# Patient Record
Sex: Female | Born: 1965 | Race: White | Hispanic: No | Marital: Married | Smoking: Never smoker
Health system: Southern US, Community
[De-identification: ages and names within clinical notes are randomized; demographics above are authoritative.]

## PROBLEM LIST (undated history)

## (undated) DIAGNOSIS — T7840XA Allergy, unspecified, initial encounter: Secondary | ICD-10-CM

## (undated) DIAGNOSIS — I872 Venous insufficiency (chronic) (peripheral): Secondary | ICD-10-CM

## (undated) DIAGNOSIS — R000 Tachycardia, unspecified: Secondary | ICD-10-CM

## (undated) DIAGNOSIS — Z803 Family history of malignant neoplasm of breast: Secondary | ICD-10-CM

## (undated) HISTORY — DX: Venous insufficiency (chronic) (peripheral): I87.2

## (undated) HISTORY — DX: Allergy, unspecified, initial encounter: T78.40XA

## (undated) HISTORY — DX: Family history of malignant neoplasm of breast: Z80.3

## (undated) HISTORY — DX: Tachycardia, unspecified: R00.0

---

## 1997-11-03 HISTORY — PX: FINGER SURGERY: SHX640

## 2000-01-13 HISTORY — PX: TONSILLECTOMY: SHX5217

## 2005-07-31 ENCOUNTER — Ambulatory Visit: Payer: Self-pay | Admitting: Unknown Physician Specialty

## 2005-08-06 ENCOUNTER — Ambulatory Visit: Payer: Self-pay | Admitting: Unknown Physician Specialty

## 2006-05-18 ENCOUNTER — Ambulatory Visit: Payer: Self-pay

## 2007-08-25 ENCOUNTER — Ambulatory Visit: Payer: Self-pay

## 2008-09-13 ENCOUNTER — Ambulatory Visit: Payer: Self-pay

## 2009-08-01 ENCOUNTER — Ambulatory Visit: Payer: Self-pay | Admitting: Unknown Physician Specialty

## 2009-08-01 ENCOUNTER — Other Ambulatory Visit: Payer: Self-pay | Admitting: Unknown Physician Specialty

## 2009-09-14 ENCOUNTER — Ambulatory Visit: Payer: Self-pay

## 2009-11-03 DIAGNOSIS — I872 Venous insufficiency (chronic) (peripheral): Secondary | ICD-10-CM

## 2009-11-03 HISTORY — DX: Venous insufficiency (chronic) (peripheral): I87.2

## 2010-08-21 ENCOUNTER — Ambulatory Visit: Payer: Self-pay

## 2010-12-25 ENCOUNTER — Ambulatory Visit: Payer: Self-pay | Admitting: Cardiology

## 2011-02-02 LAB — HM PAP SMEAR: HM Pap smear: NORMAL

## 2011-08-25 ENCOUNTER — Ambulatory Visit: Payer: Self-pay | Admitting: Unknown Physician Specialty

## 2012-09-04 LAB — HEMOGLOBIN A1C: Hgb A1c MFr Bld: 6 % (ref 4.0–6.0)

## 2012-09-07 ENCOUNTER — Ambulatory Visit: Payer: Self-pay | Admitting: General Practice

## 2012-09-13 ENCOUNTER — Ambulatory Visit: Payer: Self-pay | Admitting: Obstetrics and Gynecology

## 2012-09-13 LAB — TSH: Thyroid Stimulating Horm: 2.74 u[IU]/mL

## 2013-09-09 ENCOUNTER — Ambulatory Visit: Payer: Self-pay | Admitting: Obstetrics and Gynecology

## 2014-09-11 ENCOUNTER — Ambulatory Visit: Payer: Self-pay | Admitting: Obstetrics and Gynecology

## 2014-09-11 LAB — HM MAMMOGRAPHY: HM Mammogram: NEGATIVE

## 2014-11-13 ENCOUNTER — Other Ambulatory Visit: Payer: Self-pay | Admitting: General Surgery

## 2014-11-13 DIAGNOSIS — J069 Acute upper respiratory infection, unspecified: Secondary | ICD-10-CM

## 2014-11-13 MED ORDER — AZITHROMYCIN 250 MG PO TABS: ORAL_TABLET | ORAL | Status: AC

## 2015-02-02 ENCOUNTER — Ambulatory Visit (INDEPENDENT_AMBULATORY_CARE_PROVIDER_SITE_OTHER): Payer: 59 | Admitting: Internal Medicine

## 2015-02-02 ENCOUNTER — Encounter: Payer: Self-pay | Admitting: Internal Medicine

## 2015-02-02 ENCOUNTER — Encounter (INDEPENDENT_AMBULATORY_CARE_PROVIDER_SITE_OTHER): Payer: Self-pay

## 2015-02-02 VITALS — BP 124/84 | HR 80 | Temp 98.1°F | Resp 16 | Ht 65.25 in | Wt 205.2 lb

## 2015-02-02 DIAGNOSIS — E785 Hyperlipidemia, unspecified: Secondary | ICD-10-CM | POA: Diagnosis not present

## 2015-02-02 DIAGNOSIS — Z1159 Encounter for screening for other viral diseases: Secondary | ICD-10-CM | POA: Diagnosis not present

## 2015-02-02 DIAGNOSIS — I471 Supraventricular tachycardia: Secondary | ICD-10-CM | POA: Diagnosis not present

## 2015-02-02 DIAGNOSIS — R739 Hyperglycemia, unspecified: Secondary | ICD-10-CM

## 2015-02-02 DIAGNOSIS — E669 Obesity, unspecified: Secondary | ICD-10-CM

## 2015-02-02 DIAGNOSIS — E559 Vitamin D deficiency, unspecified: Secondary | ICD-10-CM

## 2015-02-02 DIAGNOSIS — G4700 Insomnia, unspecified: Secondary | ICD-10-CM

## 2015-02-02 DIAGNOSIS — E28319 Asymptomatic premature menopause: Secondary | ICD-10-CM

## 2015-02-02 LAB — LIPID PANEL
Cholesterol: 199 mg/dL (ref 0–200)
HDL: 45.2 mg/dL (ref >39.00)
LDL Cholesterol: 128 mg/dL — ABNORMAL HIGH (ref 0–99)
NonHDL: 153.8
Total CHOL/HDL Ratio: 4
Triglycerides: 129 mg/dL (ref 0.0–149.0)
VLDL: 25.8 mg/dL (ref 0.0–40.0)

## 2015-02-02 LAB — CBC WITH DIFFERENTIAL/PLATELET
Basophils Absolute: 0 10*3/uL (ref 0.0–0.1)
Basophils Relative: 0.2 % (ref 0.0–3.0)
Eosinophils Absolute: 0 10*3/uL (ref 0.0–0.7)
Eosinophils Relative: 1 % (ref 0.0–5.0)
HCT: 38.5 % (ref 36.0–46.0)
Hemoglobin: 13.1 g/dL (ref 12.0–15.0)
Lymphocytes Relative: 26.9 % (ref 12.0–46.0)
Lymphs Abs: 1.2 10*3/uL (ref 0.7–4.0)
MCHC: 33.9 g/dL (ref 30.0–36.0)
MCV: 80.8 fl (ref 78.0–100.0)
Monocytes Absolute: 0.4 10*3/uL (ref 0.1–1.0)
Monocytes Relative: 8.5 % (ref 3.0–12.0)
Neutro Abs: 2.8 10*3/uL (ref 1.4–7.7)
Neutrophils Relative %: 63.4 % (ref 43.0–77.0)
Platelets: 262 10*3/uL (ref 150.0–400.0)
RBC: 4.77 Mil/uL (ref 3.87–5.11)
RDW: 14.4 % (ref 11.5–15.5)
WBC: 4.4 10*3/uL (ref 4.0–10.5)

## 2015-02-02 LAB — COMPREHENSIVE METABOLIC PANEL
ALT: 14 U/L (ref 0–35)
AST: 18 U/L (ref 0–37)
Albumin: 4.4 g/dL (ref 3.5–5.2)
Alkaline Phosphatase: 81 U/L (ref 39–117)
BUN: 17 mg/dL (ref 6–23)
CO2: 28 mEq/L (ref 19–32)
Calcium: 9.9 mg/dL (ref 8.4–10.5)
Chloride: 104 mEq/L (ref 96–112)
Creatinine, Ser: 0.93 mg/dL (ref 0.40–1.20)
GFR: 68.71 mL/min (ref >60.00)
Glucose, Bld: 103 mg/dL — ABNORMAL HIGH (ref 70–99)
Potassium: 4 mEq/L (ref 3.5–5.1)
Sodium: 138 mEq/L (ref 135–145)
Total Bilirubin: 0.5 mg/dL (ref 0.2–1.2)
Total Protein: 7.7 g/dL (ref 6.0–8.3)

## 2015-02-02 LAB — VITAMIN D 25 HYDROXY (VIT D DEFICIENCY, FRACTURES): VITD: 35.32 ng/mL (ref 30.00–100.00)

## 2015-02-02 LAB — MAGNESIUM: Magnesium: 2.2 mg/dL (ref 1.5–2.5)

## 2015-02-02 LAB — HEMOGLOBIN A1C: Hgb A1c MFr Bld: 5.9 % (ref 4.6–6.5)

## 2015-02-02 LAB — TSH: TSH: 1.8 u[IU]/mL (ref 0.35–4.50)

## 2015-02-02 NOTE — Progress Notes (Addendum)
Patient ID: Cynthia Johnson, female   DOB: [birth year], 49 y.o.   MRN: [id number]  Patient Active Problem List   Diagnosis Date Noted  . SVT (supraventricular tachycardia) 02/04/2015  . Obesity 02/04/2015  . Premature menopause 02/04/2015  . Insomnia 02/04/2015    Subjective:  CC:   Chief Complaint  Patient presents with  . Establish Care    HPI:   Cynthia Schier Hatchis a 49 y.o. female who presents  Recurrent episodes of tachycardia.  Diagnosed with SCT by cardiology 4 years ago after a normal cardiac workup and symptoms/pulse was controlled with metoprolol for one year,.  She has had no issues for the past 3 years until the last several months. Dr. Ubaldo Glassing advocated repeating the workup but she has declined .  Her symptoms have been limited to dyspnea and palpitations, which occur both at rest and at work.  She does not exercise.  She had early menopause in  2007 at age 37.  2 kids age 89 and 10   Last PAP was 2012,  She thinks.  Last mammogram Nov 2015    Past Medical History  Diagnosis Date  . Sinus tachycardia 2012/ 2016    Fath  . Venous insufficiency 2011    SAnkar    Allergies  Allergen Reactions  . Codeine Nausea And Vomiting  . Meperidine And Related Nausea And Vomiting     Past Surgical History  Procedure Laterality Date  . 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 Tonsillectomy Bilateral 01/13/2000    History   Social History  . Marital Status: Married    Spouse Name: N/A  . Number of Children: N/A  . Years of Education: 16   Occupational History  . registered nurse [employer]   Social History Main Topics  . Smoking status: Never Smoker   . Smokeless tobacco: Never Used  . Alcohol Use: No  . Drug Use: No  . Sexual Activity: Yes   Other Topics Concern  . Not on file   Social History Narrative   RN for [employer]   Family History  Problem Relation Age of Onset  . Heart disease Father   . Hyperlipidemia  Father   . Thyroid disease Mother   . Cancer Mother 73    Breast BRCA Negative  . Cancer Maternal Grandmother 60    breast  . Cancer Maternal Aunt 66    breast  . Lung cancer Maternal Grandfather 66    smoker     Review of Systems:   The rest of the review of systems was negative except those addressed in the HPI.      Objective:  BP 124/84 mmHg  Pulse 80  Temp(Src) 98.1 F (36.7 C) (Oral)  Resp 16  Ht 5' 5.25" (1.657 m)  Wt 205 lb 4 oz (93.101 kg)  BMI 33.91 kg/m2  SpO2 99%  General appearance: alert, cooperative and appears stated age Ears: normal TM's and external ear canals both ears Throat: lips, mucosa, and tongue normal; teeth and gums normal Neck: no adenopathy, no carotid bruit, supple, symmetrical, trachea midline and thyroid not enlarged, symmetric, no tenderness/mass/nodules Back: symmetric, no curvature. ROM normal. No CVA tenderness. Lungs: clear to auscultation bilaterally Heart: regular rate and rhythm, S1, S2 normal, no murmur, click, rub or gallop Abdomen: soft, non-tender; bowel sounds normal; no masses,  no organomegaly Pulses: 2+ and symmetric Skin: Skin color, texture, turgor normal. No rashes or  lesions Lymph nodes: Cervical, supraclavicular, and axillary nodes normal.  Assessment and Plan:  SVT (supraventricular tachycardia) Has been prescribed Toprol by dr Ubaldo Glassing, and has folllow up in 2 weeks to discuss repeating ECHO AND STRESS .   Obesity I have addressed   BMI and encouraged  And low glycemic index diet a nd Starting a  regular exercise proram program to improve her conditioning a minimum of 5 days per week. There are no signs of DM, hyperlipidemia or hypothyroidism on today's labs.     Insomnia Managed with melatonin and benadryl.  No changes today    Premature menopause sinc e2007,  With ongoing use of OCPS for birth control (vs HRT?)  per Gyn until  Age 28 (?) records from GYN requested.    Updated Medication  List Outpatient Encounter Prescriptions as of 02/02/2015  Medication Sig  . B Complex-Biotin-FA (TH VITAMIN B 50/B-COMPLEX) TABS Take 1 tablet by mouth daily.  . calcium carbonate (OS-CAL) 600 MG TABS tablet Take 1 tablet by mouth daily.  . Cholecalciferol 2000 UNITS TABS Take 1 tablet by mouth daily.  . diphenhydrAMINE (BENADRYL) 25 mg capsule Take 1 capsule by mouth daily.  . Estradiol-Norethindrone Acet 0.5-0.1 MG per tablet Take 1 tablet by mouth daily.  . Melatonin 5 MG TABS Take 1 capsule by mouth at bedtime.  . metoprolol succinate (TOPROL-XL) 25 MG 24 hr tablet Take 12.5 mg by mouth daily.  . vitamin E 400 UNIT capsule Take 1 capsule by mouth daily.   A total of 45  minutes of face to face time was spent with patient more than half of which was spent in counselling , reviewing prior records, and coordination of care   Orders Placed This Encounter  Procedures  . HM PAP SMEAR  . CBC with Differential/Platelet  . Comprehensive metabolic panel  . TSH  . Vit D  25 hydroxy (rtn osteoporosis monitoring)  . Lipid panel  . Hepatitis C antibody  . Magnesium  . Hemoglobin A1c    No Follow-up on file.

## 2015-02-03 LAB — HEPATITIS C ANTIBODY: HCV Ab: NEGATIVE

## 2015-02-04 ENCOUNTER — Encounter: Payer: Self-pay | Admitting: Internal Medicine

## 2015-02-04 DIAGNOSIS — I471 Supraventricular tachycardia: Secondary | ICD-10-CM | POA: Insufficient documentation

## 2015-02-04 DIAGNOSIS — E28319 Asymptomatic premature menopause: Secondary | ICD-10-CM | POA: Insufficient documentation

## 2015-02-04 DIAGNOSIS — G4700 Insomnia, unspecified: Secondary | ICD-10-CM | POA: Insufficient documentation

## 2015-02-04 DIAGNOSIS — E663 Overweight: Secondary | ICD-10-CM | POA: Insufficient documentation

## 2015-02-04 NOTE — Addendum Note (Signed)
Addended by: Crecencio Mc on: 02/04/2015 04:53 PM   Modules accepted: Level of Service

## 2015-02-04 NOTE — Assessment & Plan Note (Signed)
Has been prescribed Toprol by dr Ubaldo Glassing, and has folllow up in 2 weeks to discuss repeating ECHO AND STRESS .

## 2015-02-04 NOTE — Assessment & Plan Note (Signed)
sinc [id number],  With ongoing use of OCPS for birth control (vs HRT?)  per Gyn until  Age 49 (?) records from GYN requested.

## 2015-02-04 NOTE — Assessment & Plan Note (Signed)
Managed with melatonin and benadryl.  No changes today

## 2015-02-04 NOTE — Assessment & Plan Note (Addendum)
I have addressed   BMI and encouraged  And low glycemic index diet a nd Starting a  regular exercise proram program to improve her conditioning a minimum of 5 days per week. There are no signs of DM, hyperlipidemia or hypothyroidism on today's labs.

## 2015-02-05 ENCOUNTER — Encounter: Payer: Self-pay | Admitting: *Deleted

## 2015-07-25 ENCOUNTER — Other Ambulatory Visit: Payer: Self-pay | Admitting: General Surgery

## 2015-07-25 ENCOUNTER — Other Ambulatory Visit: Payer: Self-pay | Admitting: *Deleted

## 2015-07-25 MED ORDER — SCOPOLAMINE 1 MG/3DAYS TD PT72: 1.0000 | MEDICATED_PATCH | TRANSDERMAL | Status: DC

## 2015-07-25 NOTE — Telephone Encounter (Signed)
Rx sent per [facility]

## 2015-07-25 NOTE — Telephone Encounter (Signed)
Asking Dr Jamal Collin for scopolamine patch for motion sickness. Thanks

## 2015-08-01 ENCOUNTER — Other Ambulatory Visit: Payer: Self-pay | Admitting: Internal Medicine

## 2015-08-01 DIAGNOSIS — Z1231 Encounter for screening mammogram for malignant neoplasm of breast: Secondary | ICD-10-CM

## 2015-09-14 ENCOUNTER — Ambulatory Visit
Admission: RE | Admit: 2015-09-14 | Discharge: 2015-09-14 | Disposition: A | Discharge status: Home / Self Care | Payer: 59 | Source: Ambulatory Visit | Attending: Internal Medicine | Admitting: Internal Medicine

## 2015-09-14 ENCOUNTER — Other Ambulatory Visit: Payer: Self-pay | Admitting: Internal Medicine

## 2015-09-14 DIAGNOSIS — Z1231 Encounter for screening mammogram for malignant neoplasm of breast: Secondary | ICD-10-CM | POA: Insufficient documentation

## 2016-02-08 ENCOUNTER — Encounter: Payer: Self-pay | Admitting: Internal Medicine

## 2016-02-08 ENCOUNTER — Ambulatory Visit (INDEPENDENT_AMBULATORY_CARE_PROVIDER_SITE_OTHER): Payer: 59 | Admitting: Internal Medicine

## 2016-02-08 VITALS — BP 108/78 | HR 87 | Temp 98.4°F | Resp 12 | Ht 65.75 in | Wt 173.5 lb

## 2016-02-08 DIAGNOSIS — R7301 Impaired fasting glucose: Secondary | ICD-10-CM

## 2016-02-08 DIAGNOSIS — Z7289 Other problems related to lifestyle: Secondary | ICD-10-CM | POA: Diagnosis not present

## 2016-02-08 DIAGNOSIS — R634 Abnormal weight loss: Secondary | ICD-10-CM | POA: Diagnosis not present

## 2016-02-08 DIAGNOSIS — E785 Hyperlipidemia, unspecified: Secondary | ICD-10-CM | POA: Diagnosis not present

## 2016-02-08 DIAGNOSIS — E559 Vitamin D deficiency, unspecified: Secondary | ICD-10-CM | POA: Diagnosis not present

## 2016-02-08 DIAGNOSIS — Z0000 Encounter for general adult medical examination without abnormal findings: Secondary | ICD-10-CM

## 2016-02-08 DIAGNOSIS — E669 Obesity, unspecified: Secondary | ICD-10-CM

## 2016-02-08 DIAGNOSIS — I471 Supraventricular tachycardia: Secondary | ICD-10-CM

## 2016-02-08 LAB — LIPID PANEL
Cholesterol: 168 mg/dL (ref 0–200)
HDL: 53.9 mg/dL (ref >39.00)
LDL Cholesterol: 102 mg/dL — ABNORMAL HIGH (ref 0–99)
NonHDL: 114.02
Total CHOL/HDL Ratio: 3
Triglycerides: 60 mg/dL (ref 0.0–149.0)
VLDL: 12 mg/dL (ref 0.0–40.0)

## 2016-02-08 LAB — CBC WITH DIFFERENTIAL/PLATELET
Basophils Absolute: 0 10*3/uL (ref 0.0–0.1)
Basophils Relative: 0.4 % (ref 0.0–3.0)
Eosinophils Absolute: 0 10*3/uL (ref 0.0–0.7)
Eosinophils Relative: 0.9 % (ref 0.0–5.0)
HCT: 39.8 % (ref 36.0–46.0)
Hemoglobin: 13.1 g/dL (ref 12.0–15.0)
Lymphocytes Relative: 27.4 % (ref 12.0–46.0)
Lymphs Abs: 1.1 10*3/uL (ref 0.7–4.0)
MCHC: 32.9 g/dL (ref 30.0–36.0)
MCV: 80.5 fl (ref 78.0–100.0)
Monocytes Absolute: 0.3 10*3/uL (ref 0.1–1.0)
Monocytes Relative: 8.1 % (ref 3.0–12.0)
Neutro Abs: 2.5 10*3/uL (ref 1.4–7.7)
Neutrophils Relative %: 63.2 % (ref 43.0–77.0)
Platelets: 253 10*3/uL (ref 150.0–400.0)
RBC: 4.94 Mil/uL (ref 3.87–5.11)
RDW: 14.9 % (ref 11.5–15.5)
WBC: 4 10*3/uL (ref 4.0–10.5)

## 2016-02-08 LAB — HEMOGLOBIN A1C: Hgb A1c MFr Bld: 5.8 % (ref 4.6–6.5)

## 2016-02-08 LAB — COMPREHENSIVE METABOLIC PANEL
ALT: 14 U/L (ref 0–35)
AST: 18 U/L (ref 0–37)
Albumin: 5 g/dL (ref 3.5–5.2)
Alkaline Phosphatase: 115 U/L (ref 39–117)
BUN: 19 mg/dL (ref 6–23)
CO2: 28 mEq/L (ref 19–32)
Calcium: 10.7 mg/dL — ABNORMAL HIGH (ref 8.4–10.5)
Chloride: 102 mEq/L (ref 96–112)
Creatinine, Ser: 0.89 mg/dL (ref 0.40–1.20)
GFR: 71.98 mL/min (ref >60.00)
Glucose, Bld: 103 mg/dL — ABNORMAL HIGH (ref 70–99)
Potassium: 4.2 mEq/L (ref 3.5–5.1)
Sodium: 141 mEq/L (ref 135–145)
Total Bilirubin: 0.6 mg/dL (ref 0.2–1.2)
Total Protein: 8 g/dL (ref 6.0–8.3)

## 2016-02-08 LAB — HIV ANTIBODY (ROUTINE TESTING W REFLEX): HIV 1&2 Ab, 4th Generation: NONREACTIVE

## 2016-02-08 LAB — VITAMIN D 25 HYDROXY (VIT D DEFICIENCY, FRACTURES): VITD: 66.6 ng/mL (ref 30.00–100.00)

## 2016-02-08 NOTE — Progress Notes (Signed)
Pre-visit discussion using our clinic review tool. No additional management support is needed unless otherwise documented below in the visit note.  

## 2016-02-08 NOTE — Patient Instructions (Addendum)
You have lost 15% of your body weight in one year!!  (32 lbs)  YOU ARE THE BIGGEST LOSER IN [facility]!!  Whenever  you hit a plateau,   Reevaluate the exercise component of your diet plan.   30 minutes of continuous movement  5 days per week is your goal    To make a low carb chip :  Take the Joseph's Lavash or Pita bread,  Or the [place] Low carb whole wheat tortilla   Place on metal cookie sheet  Brush with olive oil  Sprinkle garlic powder (NOT garlic salt), grated parmesan cheese, mediterranean seasoning , or all of them?  Bake at 225 or 250 for 90 minutes   We have substitutions for your potatoes!!  Try the mashed cauliflower and riced cauliflower dishes instead of rice and mashed potatoes  Mashed turnips are also very low carb!   For dessert:  Try the Dannon Lt n Fit greek yogurt dessert flavors and top with reddi Whip .  8 carbs,  80 calories  Try Oikos Triple Zero [place] Yogurt in the salted caramel, and the coffee flavors  With Whipped Cream for dessert

## 2016-02-08 NOTE — Progress Notes (Signed)
Patient ID: Cynthia Johnson, female    DOB: 1965/11/11  Age: 50 y.o. MRN: [id number]  The patient is here for annual  wellness examination and management of other chronic and acute problems.   PAP done by GYN  Mammogram Nov 2016 normal   32 lb weight loss since last year ! The risk factors are reflected in the social history.  The roster of all physicians providing medical care to patient - is listed in the Snapshot section of the chart.  Activities of daily living:  The patient is 100% independent in all ADLs: dressing, toileting, feeding as well as independent mobility  Home safety : The patient has smoke detectors in the home. They wear seatbelts.  There are no firearms at home. There is no violence in the home.   There is no risks for hepatitis, STDs or HIV. There is no   history of blood transfusion. They have no travel history to infectious disease endemic areas of the world.  The patient has seen their dentist in the last six month. They have seen their eye doctor in the last year. They admit to slight hearing difficulty with regard to whispered voices and some television programs.  They have deferred audiologic testing in the last year.  They do not  have excessive sun exposure. Discussed the need for sun protection: hats, long sleeves and use of sunscreen if there is significant sun exposure.   Diet: the importance of a healthy diet is discussed. They do have a healthy diet.  The benefits of regular aerobic exercise were discussed. She walks 4 times per week ,  20 minutes.   Depression screen: there are no signs or vegative symptoms of depression- irritability, change in appetite, anhedonia, sadness/tearfullness.  Cognitive assessment: the patient manages all their financial and personal affairs and is actively engaged. They could relate day,date,year and events; recalled 2/3 objects at 3 minutes; performed clock-face test normally.  The following portions of the patient's history  were reviewed and updated as appropriate: allergies, current medications, past family history, past medical history,  past surgical history, past social history  and problem list.  Visual acuity was not assessed per patient preference since she has regular follow up with her ophthalmologist. Hearing and body mass index were assessed and reviewed.   During the course of the visit the patient was educated and counseled about appropriate screening and preventive services including : fall prevention , diabetes screening, nutrition counseling, colorectal cancer screening, and recommended immunizations.    CC: The primary encounter diagnosis was Vitamin D deficiency. Diagnoses of Impaired fasting glucose, Other problems related to lifestyle, Hyperlipidemia, Loss of weight, Paroxysmal supraventricular tachycardia ([place]), Visit for preventive health examination, and Obesity were also pertinent to this visit.  History Cynthia Johnson has a past medical history of Sinus tachycardia ([place]) (2012/ 2016) and Venous insufficiency (2011).   She has past surgical history that includes Finger surgery (Left, 1999) and Tonsillectomy (Bilateral, 01/13/2000).   Her family history includes Breast cancer (age of onset: 44) in her mother; Breast cancer (age of onset: 75) in her maternal aunt; Breast cancer (age of onset: 18) in her maternal grandmother; Cancer (age of onset: 56) in her mother; Cancer (age of onset: 16) in her maternal grandmother; Cancer (age of onset: 38) in her maternal aunt; Heart disease in her father; Hyperlipidemia in her father; Lung cancer (age of onset: 58) in her maternal grandfather; Thyroid disease in her mother.She reports that she has never smoked. She has  never used smokeless tobacco. She reports that she does not drink alcohol or use illicit drugs.  Outpatient Prescriptions Prior to Visit  Medication Sig Dispense Refill  . B Complex-Biotin-FA (TH VITAMIN B 50/B-COMPLEX) TABS Take 1 tablet by mouth  daily.    . calcium carbonate (OS-CAL) 600 MG TABS tablet Take 1 tablet by mouth daily.    . Cholecalciferol 2000 UNITS TABS Take 1 tablet by mouth daily.    . diphenhydrAMINE (BENADRYL) 25 mg capsule Take 1 capsule by mouth daily.    . Melatonin 5 MG TABS Take 1 capsule by mouth at bedtime.    Marland Kitchen scopolamine (TRANSDERM-SCOP) 1 MG/3DAYS Place 1 patch (1.5 mg total) onto the skin every 3 (three) days. 4 patch 1  . vitamin E 400 UNIT capsule Take 1 capsule by mouth daily.    . Estradiol-Norethindrone Acet 0.5-0.1 MG per tablet Take 1 tablet by mouth daily. Reported on 02/08/2016    . metoprolol succinate (TOPROL-XL) 25 MG 24 hr tablet Take 12.5 mg by mouth daily.     No facility-administered medications prior to visit.    Review of Systems   Patient denies headache, fevers, malaise, unintentional weight loss, skin rash, eye pain, sinus congestion and sinus pain, sore throat, dysphagia,  hemoptysis , cough, dyspnea, wheezing, chest pain, palpitations, orthopnea, edema, abdominal pain, nausea, melena, diarrhea, constipation, flank pain, dysuria, hematuria, urinary  Frequency, nocturia, numbness, tingling, seizures,  Focal weakness, Loss of consciousness,  Tremor, insomnia, depression, anxiety, and suicidal ideation.      Objective:  BP 108/78 mmHg  Pulse 87  Temp(Src) 98.4 F (36.9 C) (Oral)  Resp 12  Ht 5' 5.75" (1.67 m)  Wt 173 lb 8 oz (78.699 kg)  BMI 28.22 kg/m2  SpO2 99%  Physical Exam  General appearance: alert, cooperative and appears stated age Head: Normocephalic, without obvious abnormality, atraumatic Eyes: conjunctivae/corneas clear. PERRL, EOM's intact. Fundi benign. Ears: normal TM's and external ear canals both ears Nose: Nares normal. Septum midline. Mucosa normal. No drainage or sinus tenderness. Throat: lips, mucosa, and tongue normal; teeth and gums normal Neck: no adenopathy, no carotid bruit, no JVD, supple, symmetrical, trachea midline and thyroid not enlarged,  symmetric, no tenderness/mass/nodules Lungs: clear to auscultation bilaterally Breasts: normal appearance, no masses or tenderness Heart: regular rate and rhythm, S1, S2 normal, no murmur, click, rub or gallop Abdomen: soft, non-tender; bowel sounds normal; no masses,  no organomegaly Extremities: extremities normal, atraumatic, no cyanosis or edema Pulses: 2+ and symmetric Skin: Skin color, texture, turgor normal. No rashes or lesions Neurologic: Alert and oriented X 3, normal strength and tone. Normal symmetric reflexes. Normal coordination and gait.   Assessment & Plan:   Problem List Items Addressed This Visit    Obesity    I have congratulated her in reduction of   BMI and encouraged  Continued weight loss with goal of 10% of body weigh over the next 6 months using a low glycemic index diet and regular exercise a minimum of 5 days per week.        Visit for preventive health examination    Other Visit Diagnoses    Vitamin D deficiency    -  Primary    Relevant Orders    VITAMIN D 25 Hydroxy (Vit-D Deficiency, Fractures) (Completed)    Impaired fasting glucose        Relevant Orders    Hemoglobin A1c (Completed)    Other problems related to lifestyle  Relevant Orders    HIV antibody (Completed)    Hyperlipidemia        Relevant Orders    Lipid panel (Completed)    Loss of weight        Relevant Orders    Comprehensive metabolic panel (Completed)    T4 AND TSH (Completed)    Paroxysmal supraventricular tachycardia (HCC)        Relevant Orders    CBC with Differential/Platelet (Completed)    Hemoglobin A1c (Completed)    T4 AND TSH (Completed)       I have discontinued Ms. Poffenberger's Estradiol-Norethindrone Acet and metoprolol succinate. I am also having her maintain her calcium carbonate, Cholecalciferol, diphenhydrAMINE, Melatonin, TH VITAMIN B 50/B-COMPLEX, vitamin E, and scopolamine.  No orders of the defined types were placed in this encounter.     Medications Discontinued During This Encounter  Medication Reason  . metoprolol succinate (TOPROL-XL) 25 MG 24 hr tablet Patient Preference  . Estradiol-Norethindrone Acet 0.5-0.1 MG per tablet Patient Preference    Follow-up: No Follow-up on file.   Crecencio Mc, MD

## 2016-02-09 LAB — T4 AND TSH
T4, Total: 10.1 ug/dL (ref 4.5–12.0)
TSH: 2.48 u[IU]/mL (ref 0.450–4.500)

## 2016-02-10 ENCOUNTER — Encounter: Payer: Self-pay | Admitting: Internal Medicine

## 2016-02-10 ENCOUNTER — Other Ambulatory Visit: Payer: Self-pay | Admitting: Internal Medicine

## 2016-02-10 DIAGNOSIS — Z0000 Encounter for general adult medical examination without abnormal findings: Secondary | ICD-10-CM | POA: Insufficient documentation

## 2016-02-10 NOTE — Assessment & Plan Note (Signed)
I have congratulated her in reduction of   BMI and encouraged  Continued weight loss with goal of 10% of body weigh over the next 6 months using a low glycemic index diet and regular exercise a minimum of 5 days per week.    

## 2016-03-05 ENCOUNTER — Encounter: Payer: Self-pay | Admitting: Internal Medicine

## 2016-03-05 MED ORDER — ALPRAZOLAM 0.25 MG PO TABS: 0.2500 mg | ORAL_TABLET | Freq: Two times a day (BID) | ORAL | Status: DC | PRN

## 2016-03-07 ENCOUNTER — Other Ambulatory Visit (INDEPENDENT_AMBULATORY_CARE_PROVIDER_SITE_OTHER): Payer: 59

## 2016-03-07 LAB — BASIC METABOLIC PANEL
BUN: 18 mg/dL (ref 6–23)
CO2: 24 mEq/L (ref 19–32)
Calcium: 9.9 mg/dL (ref 8.4–10.5)
Chloride: 106 mEq/L (ref 96–112)
Creatinine, Ser: 0.81 mg/dL (ref 0.40–1.20)
GFR: 80.22 mL/min (ref >60.00)
Glucose, Bld: 99 mg/dL (ref 70–99)
Potassium: 4.5 mEq/L (ref 3.5–5.1)
Sodium: 140 mEq/L (ref 135–145)

## 2016-03-08 LAB — CALCIUM, IONIZED: Calcium, Ion: 1.29 mmol/L (ref 1.12–1.32)

## 2016-03-09 ENCOUNTER — Encounter: Payer: Self-pay | Admitting: Internal Medicine

## 2016-06-20 ENCOUNTER — Other Ambulatory Visit: Payer: Self-pay | Admitting: Internal Medicine

## 2016-06-20 DIAGNOSIS — Z1231 Encounter for screening mammogram for malignant neoplasm of breast: Secondary | ICD-10-CM

## 2016-07-25 ENCOUNTER — Encounter: Payer: 59 | Attending: Internal Medicine | Admitting: Dietician

## 2016-07-25 VITALS — Ht 66.0 in | Wt 194.9 lb

## 2016-07-25 DIAGNOSIS — Z713 Dietary counseling and surveillance: Secondary | ICD-10-CM | POA: Insufficient documentation

## 2016-07-25 DIAGNOSIS — E669 Obesity, unspecified: Secondary | ICD-10-CM

## 2016-07-25 NOTE — Progress Notes (Signed)
Notes from [facility] [employer] [facility] employee "self referral" nutrition session: Start time: 0900   End time: 47  Met with employee to discuss his/her nutritional concerns and diet history. The employee's questions/concerns were also addressed. She reports having lost a significant amount of weight prior to daughter's wedding, by following a very low-carb and low-calorie diet. Since the wedding, she has resumed more regular eating habits and has gained weight back. She has begun working on weight loss again and wants help with healthier, more sustainable eating pattern.   We discussed the following topics:  Healthy Eating -- appropriate nutrient balance and minimal calorie, carbohydrate needs. Instructed on basics of DASH diet as best healthy eating plan.  Weight Concerns -- Calculated energy needs for weight loss at about 1300kcal daily. Provided guidance for 40% carbohydrate, 30% protein, and 30% fat pattern, and advised at least 130g of carbohydrate daily (9 servings). Encouraged patient to gradually increase carb. Intake to recommended levels.    I also provided the following handouts as reinforcement of the educational session:  Planning a Balanced Meal  DASH diet guide (modified for weight loss)  Sample menus and/or recipes    Goals Agreed Upon:  Patient to add 1-2 servings of carb foods on a daily basis, and add another serving every few weeks until reaching goal.   Patient to follow plan for 1300kcal daily intake.   Patient to call with any questions or concerns.

## 2016-07-29 ENCOUNTER — Ambulatory Visit (INDEPENDENT_AMBULATORY_CARE_PROVIDER_SITE_OTHER): Payer: 59 | Admitting: General Surgery

## 2016-07-29 DIAGNOSIS — H00013 Hordeolum externum right eye, unspecified eyelid: Secondary | ICD-10-CM

## 2016-07-29 MED ORDER — NEOMYCIN-POLYMYXIN-DEXAMETH 3.5-10000-0.1 OP SUSP: 2.0000 [drp] | Freq: Two times a day (BID) | OPHTHALMIC | 1 refills | Status: DC

## 2016-07-29 NOTE — Progress Notes (Signed)
Patient ID: Cynthia Johnson, female   DOB: [birth year], 50 y.o.   MRN: [id number]  Chief Complaint  Patient presents with  . Other    eye sty    HPI Cynthia Johnson is a 50 y.o. female here today for a evaluation of a right eye stye. Patient states she noticed this about one week ago. She does have some discomfort and swelling for about 1 week.  HPI  Past Medical History:  Diagnosis Date  . Sinus tachycardia ([place]) 2012/ 2016   Fath  . Venous insufficiency 2011   SAnkar    Past Surgical History:  Procedure Laterality Date  . 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 TONSILLECTOMY Bilateral 01/13/2000    Family History  Problem Relation Age of Onset  . Heart disease Father   . Hyperlipidemia Father   . Thyroid disease Mother   . Cancer Mother 16    Breast BRCA Negative  . Breast cancer Mother 74    BRCA neg  . Cancer Maternal Grandmother 60    breast  . Breast cancer Maternal Grandmother 68  . Cancer Maternal Aunt 66    breast  . Breast cancer Maternal Aunt 66  . Lung cancer Maternal Grandfather 63    smoker    Social History Social History  Substance Use Topics  . Smoking status: Never Smoker  . Smokeless tobacco: Never Used  . Alcohol use No    Allergies  Allergen Reactions  . Codeine Nausea And Vomiting  . Meperidine And Related Nausea And Vomiting    Current Outpatient Prescriptions  Medication Sig Dispense Refill  . B Complex-Biotin-FA (TH VITAMIN B 50/B-COMPLEX) TABS Take 1 tablet by mouth daily.    . calcium carbonate (OS-CAL) 600 MG TABS tablet Take 1 tablet by mouth daily.    . Cholecalciferol 2000 UNITS TABS Take 1 tablet by mouth daily.    . diphenhydrAMINE (BENADRYL) 25 mg capsule Take 1 capsule by mouth at bedtime.     . Melatonin 5 MG TABS Take 1 capsule by mouth at bedtime.    . vitamin E 400 UNIT capsule Take 1 capsule by mouth daily.    Marland Kitchen scopolamine (TRANSDERM-SCOP) 1 MG/3DAYS Place 1 patch (1.5 mg total) onto the skin  every 3 (three) days. (Patient not taking: Reported on 07/29/2016) 4 patch 1   No current facility-administered medications for this visit.     Review of Systems Review of Systems  Constitutional: Negative.   Respiratory: Negative.   Cardiovascular: Negative.     There were no vitals taken for this visit.  Physical Exam Physical Exam  Eyes:         Assessment    Inflammatory process involving the right lower eyelid.    Plan    Case reviewed with ophthalmology. Maxitrol drops recommended. Rx applied. We'll follow-up in 2 days.     This information has been scribed by Gaspar Cola CMA.   Robert Bellow 07/29/2016, 10:13 AM

## 2016-07-29 NOTE — Patient Instructions (Signed)
Follow up appointment to be announced.  

## 2016-09-15 ENCOUNTER — Ambulatory Visit
Admission: RE | Admit: 2016-09-15 | Discharge: 2016-09-15 | Disposition: A | Discharge status: Home / Self Care | Payer: 59 | Source: Ambulatory Visit | Attending: Internal Medicine | Admitting: Internal Medicine

## 2016-09-15 ENCOUNTER — Other Ambulatory Visit: Payer: Self-pay | Admitting: Internal Medicine

## 2016-09-15 DIAGNOSIS — Z1231 Encounter for screening mammogram for malignant neoplasm of breast: Secondary | ICD-10-CM | POA: Insufficient documentation

## 2016-10-20 DIAGNOSIS — Z1151 Encounter for screening for human papillomavirus (HPV): Secondary | ICD-10-CM | POA: Diagnosis not present

## 2016-10-20 DIAGNOSIS — N951 Menopausal and female climacteric states: Secondary | ICD-10-CM | POA: Diagnosis not present

## 2016-10-20 DIAGNOSIS — Z1239 Encounter for other screening for malignant neoplasm of breast: Secondary | ICD-10-CM | POA: Diagnosis not present

## 2016-10-20 DIAGNOSIS — Z315 Encounter for genetic counseling: Secondary | ICD-10-CM | POA: Diagnosis not present

## 2016-10-20 DIAGNOSIS — Z803 Family history of malignant neoplasm of breast: Secondary | ICD-10-CM | POA: Diagnosis not present

## 2016-10-20 DIAGNOSIS — Z124 Encounter for screening for malignant neoplasm of cervix: Secondary | ICD-10-CM | POA: Diagnosis not present

## 2016-10-20 DIAGNOSIS — Z01419 Encounter for gynecological examination (general) (routine) without abnormal findings: Secondary | ICD-10-CM | POA: Diagnosis not present

## 2016-10-31 LAB — HM PAP SMEAR: HM Pap smear: NORMAL

## 2017-01-07 ENCOUNTER — Encounter: Payer: Self-pay | Admitting: General Surgery

## 2017-01-07 ENCOUNTER — Ambulatory Visit (INDEPENDENT_AMBULATORY_CARE_PROVIDER_SITE_OTHER): Payer: 59 | Admitting: General Surgery

## 2017-01-07 VITALS — BP 122/74 | Ht 66.0 in | Wt 190.0 lb

## 2017-01-07 DIAGNOSIS — M545 Low back pain: Secondary | ICD-10-CM

## 2017-01-07 DIAGNOSIS — M4696 Unspecified inflammatory spondylopathy, lumbar region: Secondary | ICD-10-CM | POA: Diagnosis not present

## 2017-01-07 MED ORDER — MELOXICAM 7.5 MG PO TABS: 7.5000 mg | ORAL_TABLET | Freq: Every day | ORAL | 0 refills | Status: DC

## 2017-01-07 NOTE — Progress Notes (Signed)
Patient ID: Cynthia Johnson, female   DOB: Oct 15, 1966, 51 y.o.   MRN: [id number]  Chief Complaint  Patient presents with  . Other    back pain     HPI Cynthia Johnson is a 51 y.o. female here today for a evaluation of lower back pain. Patient states on Monday she was pulling weed and on Tuseday she went to a class at the gym. In the middle of the night the pain started. Pain is in the left lower back, she is able to stand but lifting left leg causes excruciating pain. I have reviewed the history of present illness with the patient.  HPI  Past Medical History:  Diagnosis Date  . Sinus tachycardia 2012/ 2016   Fath  . Venous insufficiency 2011   Ordell Prichett    Past Surgical History:  Procedure Laterality Date  . 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 TONSILLECTOMY Bilateral 01/13/2000    Family History  Problem Relation Age of Onset  . Heart disease Father   . Hyperlipidemia Father   . Thyroid disease Mother   . Cancer Mother 70    Breast BRCA Negative  . Breast cancer Mother 51    BRCA neg  . Cancer Maternal Grandmother 60    breast  . Breast cancer Maternal Grandmother 68  . Cancer Maternal Aunt 66    breast  . Breast cancer Maternal Aunt 66  . Lung cancer Maternal Grandfather 11    smoker    Social History Social History  Substance Use Topics  . Smoking status: Never Smoker  . Smokeless tobacco: Never Used  . Alcohol use No    Allergies  Allergen Reactions  . Codeine Nausea And Vomiting  . Meperidine And Related Nausea And Vomiting    Current Outpatient Prescriptions  Medication Sig Dispense Refill  . B Complex-Biotin-FA (TH VITAMIN B 50/B-COMPLEX) TABS Take 1 tablet by mouth daily.    . calcium carbonate (OS-CAL) 600 MG TABS tablet Take 1 tablet by mouth daily.    . diphenhydrAMINE (BENADRYL) 25 mg capsule Take 1 capsule by mouth at bedtime.     . Melatonin 5 MG TABS Take 1 capsule by mouth at bedtime.    Marland Kitchen neomycin-polymyxin  b-dexamethasone (MAXITROL) 3.5-10000-0.1 SUSP Place 2 drops into the right eye 2 (two) times daily. 1 Bottle 1  . scopolamine (TRANSDERM-SCOP) 1 MG/3DAYS Place 1 patch (1.5 mg total) onto the skin every 3 (three) days. 4 patch 1  . vitamin E 400 UNIT capsule Take 1 capsule by mouth daily.    . meloxicam (MOBIC) 7.5 MG tablet Take 1 tablet (7.5 mg total) by mouth daily. 14 tablet 0   No current facility-administered medications for this visit.     Review of Systems Review of Systems  Constitutional: Negative.   Respiratory: Negative.   Cardiovascular: Negative.     Blood pressure 122/74, height '5\' 6"'  (1.676 m), weight 190 lb (86.2 kg).  Physical Exam Physical Exam  Constitutional: She appears well-developed and well-nourished.  Eyes: Conjunctivae are normal. No scleral icterus.  Neck: Neck supple.  Cardiovascular: Regular rhythm.   Lymphadenopathy:    She has no cervical adenopathy.  Neurological: She is alert.  Skin: Skin is warm and dry.  Pt has a small patch over the area of pain. There is no focal tenderness.When she tries to lift her left leg she winces and points to painsite- over the left SI joint. No radiation of pain  into her leg  Data Reviewed    Assessment    Acute pin over the left SI joint area.     Plan    Recommended Ortho eval- she may need muscle relaxers and/or imaging. She has appt to be seen in [place] [facility] later this am.     Patient to use Mobic and use heat.  This information has been scribed by Gaspar Cola CMA.    Matthewjames Petrasek G 01/07/2017, 11:08 AM

## 2017-01-16 DIAGNOSIS — M4696 Unspecified inflammatory spondylopathy, lumbar region: Secondary | ICD-10-CM | POA: Diagnosis not present

## 2017-08-12 ENCOUNTER — Other Ambulatory Visit: Payer: Self-pay | Admitting: *Deleted

## 2017-08-12 ENCOUNTER — Other Ambulatory Visit: Payer: Self-pay | Admitting: Internal Medicine

## 2017-08-12 DIAGNOSIS — Z1239 Encounter for other screening for malignant neoplasm of breast: Secondary | ICD-10-CM

## 2017-09-01 ENCOUNTER — Telehealth: Payer: Self-pay | Admitting: *Deleted

## 2017-09-01 MED ORDER — AZITHROMYCIN 250 MG PO TABS: ORAL_TABLET | ORAL | 0 refills | Status: DC

## 2017-09-01 NOTE — Telephone Encounter (Signed)
Rx requested

## 2017-09-21 ENCOUNTER — Ambulatory Visit
Admission: RE | Admit: 2017-09-21 | Discharge: 2017-09-21 | Disposition: A | Discharge status: Home / Self Care | Payer: 59 | Source: Ambulatory Visit | Attending: General Surgery | Admitting: General Surgery

## 2017-09-21 DIAGNOSIS — Z1239 Encounter for other screening for malignant neoplasm of breast: Secondary | ICD-10-CM

## 2017-09-21 DIAGNOSIS — Z1231 Encounter for screening mammogram for malignant neoplasm of breast: Secondary | ICD-10-CM | POA: Diagnosis not present

## 2017-10-16 ENCOUNTER — Encounter: Payer: Self-pay | Admitting: Internal Medicine

## 2017-10-16 ENCOUNTER — Ambulatory Visit (INDEPENDENT_AMBULATORY_CARE_PROVIDER_SITE_OTHER): Payer: 59 | Admitting: Internal Medicine

## 2017-10-16 DIAGNOSIS — E6609 Other obesity due to excess calories: Secondary | ICD-10-CM | POA: Diagnosis not present

## 2017-10-16 DIAGNOSIS — R635 Abnormal weight gain: Secondary | ICD-10-CM

## 2017-10-16 DIAGNOSIS — Z6830 Body mass index (BMI) 30.0-30.9, adult: Secondary | ICD-10-CM

## 2017-10-16 DIAGNOSIS — Z0000 Encounter for general adult medical examination without abnormal findings: Secondary | ICD-10-CM | POA: Diagnosis not present

## 2017-10-16 DIAGNOSIS — R7301 Impaired fasting glucose: Secondary | ICD-10-CM

## 2017-10-16 DIAGNOSIS — F411 Generalized anxiety disorder: Secondary | ICD-10-CM

## 2017-10-16 LAB — COMPREHENSIVE METABOLIC PANEL
ALT: 17 U/L (ref 0–35)
AST: 19 U/L (ref 0–37)
Albumin: 4.8 g/dL (ref 3.5–5.2)
Alkaline Phosphatase: 92 U/L (ref 39–117)
BUN: 16 mg/dL (ref 6–23)
CO2: 30 mEq/L (ref 19–32)
Calcium: 9.8 mg/dL (ref 8.4–10.5)
Chloride: 102 mEq/L (ref 96–112)
Creatinine, Ser: 0.91 mg/dL (ref 0.40–1.20)
GFR: 69.66 mL/min (ref >60.00)
Glucose, Bld: 97 mg/dL (ref 70–99)
Potassium: 3.7 mEq/L (ref 3.5–5.1)
Sodium: 139 mEq/L (ref 135–145)
Total Bilirubin: 0.5 mg/dL (ref 0.2–1.2)
Total Protein: 7.9 g/dL (ref 6.0–8.3)

## 2017-10-16 LAB — TSH: TSH: 2.06 u[IU]/mL (ref 0.35–4.50)

## 2017-10-16 LAB — LIPID PANEL
Cholesterol: 147 mg/dL (ref 0–200)
HDL: 50.9 mg/dL (ref >39.00)
LDL Cholesterol: 79 mg/dL (ref 0–99)
NonHDL: 96.49
Total CHOL/HDL Ratio: 3
Triglycerides: 85 mg/dL (ref 0.0–149.0)
VLDL: 17 mg/dL (ref 0.0–40.0)

## 2017-10-16 LAB — HEMOGLOBIN A1C: Hgb A1c MFr Bld: 5.7 % (ref 4.6–6.5)

## 2017-10-16 MED ORDER — PHENTERMINE HCL 37.5 MG PO TABS: ORAL_TABLET | ORAL | 0 refills | Status: DC

## 2017-10-16 MED ORDER — PHENTERMINE HCL 8 MG PO TABS: ORAL_TABLET | ORAL | 0 refills | Status: DC

## 2017-10-16 MED ORDER — ALPRAZOLAM 0.25 MG PO TABS
0.2500 mg | ORAL_TABLET | Freq: Two times a day (BID) | ORAL | 0 refills | Status: DC | PRN
Start: 2017-10-16 — End: 2018-05-14

## 2017-10-16 NOTE — Progress Notes (Signed)
Patient ID: Cynthia Johnson, female    DOB: 18-Oct-1966  Age: 51 y.o. MRN: [id number]  The patient is here for annual preventive examination and management of other chronic and acute problems.  Last PAP smear 2012 Mammogram 2018    The risk factors are reflected in the social history.  The roster of all physicians providing medical care to patient - is listed in the Snapshot section of the chart.  Activities of daily living:  The patient is 100% independent in all ADLs: dressing, toileting, feeding as well as independent mobility  Home safety : The patient has smoke detectors in the home. They wear seatbelts.  There are no firearms at home. There is no violence in the home.   There is no risks for hepatitis, STDs or HIV. There is no   history of blood transfusion. They have no travel history to infectious disease endemic areas of the world.  The patient has seen their dentist in the last six month. They have seen their eye doctor in the last year. T   They do not  have excessive sun exposure. Discussed the need for sun protection: hats, long sleeves and use of sunscreen if there is significant sun exposure.   Diet: the importance of a healthy diet is discussed. They do have a healthy diet.  The benefits of regular aerobic exercise were discussed. She walks 3 times per week ,  20 minutes.   Depression screen: there are minor  symptoms of depression-  Increased appetite, anhedonia, sadness/tearfullness.  Her father Carlisle Cater has been diagnosed with stage 4 small cell Lung Ca and has a prognosis of about 6 months,  He is 49 yrs old.   The following portions of the patient's history were reviewed and updated as appropriate: allergies, current medications, past family history, past medical history,  past surgical history, past social history  and problem list.  Visual acuity was not assessed per patient preference since she has regular follow up with her ophthalmologist. Hearing and body  mass index were assessed and reviewed.   During the course of the visit the patient was educated and counseled about appropriate screening and preventive services including : fall prevention , diabetes screening, nutrition counseling, colorectal cancer screening, and recommended immunizations.    CC: The primary encounter diagnosis was Hypercalcemia. Diagnoses of Impaired fasting glucose, Weight gain, Visit for preventive health examination, Class 1 obesity due to excess calories without serious comorbidity with body mass index (BMI) of 30.0 to 30.9 in adult, and Generalized anxiety disorder were also pertinent to this visit.  1) anxiety: secondary to father's diagnosis of metastatic lung camcer   2) weight gain.   Work schedule is  hectic,  Eating poorly,  Not exercising    History Cynthia Johnson has a past medical history of Sinus tachycardia (2012/ 2016) and Venous insufficiency (2011).   She has a past surgical history that includes Finger surgery (Left, 1999) and Tonsillectomy (Bilateral, 01/13/2000).   Her family history includes Breast cancer (age of onset: 67) in her mother; Breast cancer (age of onset: 30) in her maternal aunt; Breast cancer (age of onset: 58) in her maternal grandmother; Cancer (age of onset: 20) in her mother; Cancer (age of onset: 19) in her maternal grandmother; Cancer (age of onset: 9) in her maternal aunt; Heart disease in her father; Hyperlipidemia in her father; Lung cancer (age of onset: 48) in her maternal grandfather; Thyroid disease in her mother.She reports that  has never smoked. she  has never used smokeless tobacco. She reports that she does not drink alcohol or use drugs.  Outpatient Medications Prior to Visit  Medication Sig Dispense Refill  . B Complex-Biotin-FA (TH VITAMIN B 50/B-COMPLEX) TABS Take 1 tablet by mouth daily.    . calcium carbonate (OS-CAL) 600 MG TABS tablet Take 1 tablet by mouth daily.    . diphenhydrAMINE (BENADRYL) 25 mg capsule Take 1  capsule by mouth at bedtime.     . Melatonin 5 MG TABS Take 1 capsule by mouth at bedtime.    Marland Kitchen scopolamine (TRANSDERM-SCOP) 1 MG/3DAYS Place 1 patch (1.5 mg total) onto the skin every 3 (three) days. 4 patch 1  . vitamin E 400 UNIT capsule Take 1 capsule by mouth daily.    Marland Kitchen azithromycin (ZITHROMAX Z-PAK) 250 MG tablet As directed (Patient not taking: Reported on 10/16/2017) 6 each 0  . meloxicam (MOBIC) 7.5 MG tablet Take 1 tablet (7.5 mg total) by mouth daily. (Patient not taking: Reported on 10/16/2017) 14 tablet 0  . neomycin-polymyxin b-dexamethasone (MAXITROL) 3.5-10000-0.1 SUSP Place 2 drops into the right eye 2 (two) times daily. (Patient not taking: Reported on 10/16/2017) 1 Bottle 1   No facility-administered medications prior to visit.     Review of Systems   Patient denies headache, fevers, malaise, unintentional weight loss, skin rash, eye pain, sinus congestion and sinus pain, sore throat, dysphagia,  hemoptysis , cough, dyspnea, wheezing, chest pain, palpitations, orthopnea, edema, abdominal pain, nausea, melena, diarrhea, constipation, flank pain, dysuria, hematuria, urinary  Frequency, nocturia, numbness, tingling, seizures,  Focal weakness, Loss of consciousness,  Tremor, insomnia, depression, anxiety, and suicidal ideation.      Objective:  BP 96/68 (BP Location: Left Arm, Patient Position: Sitting, Cuff Size: Normal)   Pulse (!) 106   Temp 98.2 F (36.8 C) (Oral)   Resp 15   Ht 5\' 6"  (1.676 m)   Wt 186 lb 9.6 oz (84.6 kg)   SpO2 97%   BMI 30.12 kg/m   Physical Exam   General appearance: alert, cooperative and appears stated age Head: Normocephalic, without obvious abnormality, atraumatic Eyes: conjunctivae/corneas clear. PERRL, EOM's intact. Fundi benign. Ears: normal TM's and external ear canals both ears Nose: Nares normal. Septum midline. Mucosa normal. No drainage or sinus tenderness. Throat: lips, mucosa, and tongue normal; teeth and gums normal Neck: no  adenopathy, no carotid bruit, no JVD, supple, symmetrical, trachea midline and thyroid not enlarged, symmetric, no tenderness/mass/nodules Lungs: clear to auscultation bilaterally Breasts: normal appearance, no masses or tenderness Heart: regular rate and rhythm, S1, S2 normal, no murmur, click, rub or gallop Abdomen: soft, non-tender; bowel sounds normal; no masses,  no organomegaly Extremities: extremities normal, atraumatic, no cyanosis or edema Pulses: 2+ and symmetric Skin: Skin color, texture, turgor normal. No rashes or lesions Neurologic: Alert and oriented X 3, normal strength and tone. Normal symmetric reflexes. Normal coordination and gait.      Assessment & Plan:   Problem List Items Addressed This Visit    Hypercalcemia - Primary   Relevant Orders   Calcium, ionized (Completed)   Generalized anxiety disorder    Precipitated by father's prognosis of metastatic lung cancer. The risks and benefits of benzodiazepine use were discussed with patient today including excessive sedation leading to respiratory depression,  impaired thinking/driving, and addiction.  Patient was advised to avoid concurrent use with alcohol, to use medication only as needed and not to share with others  .       Relevant Medications  ALPRAZolam (XANAX) 0.25 MG tablet   Obesity    I have addressed  BMI and recommended wt loss of 10% of body weight over the next 6 months using a low fat, low starch, high protein  fruit/vegetable based Mediterranean diet and 30 minutes of aerobic exercise a minimum of 5 days per week. rx for phentermine , proper use described.        Relevant Medications   phentermine (ADIPEX-P) 37.5 MG tablet   Visit for preventive health examination    Annual comprehensive preventive exam was done as well as an evaluation and management of chronic conditions .  During the course of the visit the patient was educated and counseled about appropriate screening and preventive services  including :  diabetes screening, lipid analysis with projected  10 year  risk for CAD , nutrition counseling, breast, cervical and colorectal cancer screening, and recommended immunizations.  Printed recommendations for health maintenance screenings was given.  Lab Results  Component Value Date   TSH 2.06 10/16/2017   Lab Results  Component Value Date   CHOL 147 10/16/2017   HDL 50.90 10/16/2017   LDLCALC 79 10/16/2017   TRIG 85.0 10/16/2017   CHOLHDL 3 10/16/2017   Lab Results  Component Value Date   HGBA1C 5.7 10/16/2017          Other Visit Diagnoses    Impaired fasting glucose       Relevant Orders   Hemoglobin A1c (Completed)   Comprehensive metabolic panel (Completed)   Lipid panel (Completed)   Weight gain       Relevant Orders   TSH (Completed)      I have discontinued [name]. [place]'s neomycin-polymyxin b-dexamethasone, meloxicam, azithromycin, and Phentermine HCl. I am also having her start on ALPRAZolam and phentermine. Additionally, I am having her maintain her calcium carbonate, diphenhydrAMINE, Melatonin, TH VITAMIN B 50/B-COMPLEX, vitamin E, and scopolamine.  Meds ordered this encounter  Medications  . DISCONTD: Phentermine HCl 8 MG TABS    Sig: 1/2 tablet twice daily    Dispense:  28 each    Refill:  0  . ALPRAZolam (XANAX) 0.25 MG tablet    Sig: Take 1 tablet (0.25 mg total) by mouth 2 (two) times daily as needed for anxiety.    Dispense:  20 tablet    Refill:  0  . phentermine (ADIPEX-P) 37.5 MG tablet    Sig: 1/2 tablet up to twice daily before meals    Dispense:  30 tablet    Refill:  0    Medications Discontinued During This Encounter  Medication Reason  . azithromycin (ZITHROMAX Z-PAK) 250 MG tablet Completed Course  . meloxicam (MOBIC) 7.5 MG tablet Patient has not taken in last 30 days  . neomycin-polymyxin b-dexamethasone (MAXITROL) 3.5-10000-0.1 SUSP Patient has not taken in last 30 days  . Phentermine HCl 8 MG TABS     Follow-up:  No Follow-up on file.   Crecencio Mc, MD

## 2017-10-16 NOTE — Patient Instructions (Addendum)
I am prescribing alprazolam to use as needed for insomnia/anxiety ,  And phentermine to use during your night shifts to avoid overeatin    The  diet I discussed with you today is the 10 day Green Smoothie Cleansing /Detox Diet by Linden Dolin . available on [address] for around $10.  This is not a low carb  diet,  It is fundamentally a "cleansing" low fat diet that eliminates sugar, gluten, caffeine, alcohol and dairy for 10 days .  What you add back after the initial ten days is entirely up to  you!  You can expect to lose 5 to 10 lbs depending on how strict you are and how many meals you replace with a smoothie.   I suggest drinking 2 smoothies daily and keeping one chewable meal (but keep it simple, like baked fish and salad, rice or a green vegetable ) .  You snack primarily on fresh  Fruit,hard boiled eggs  and judicious quantities of nuts. You can add vegetable based protein powder (nothing with whey , since whey is dairy) in it.  [employer] has a great selection  It does require some form of a nutrient extractor (Vita Mix, a electric juicer,  Or a Nutribullet Rx).  i have found that using frozen fruits and vegetables is  convenient and cost effective. You can even find plenty of organic fruit in the frozen fruit section of [employer].  Just thaw what you need for the following day the night before in the refrigerator (to avoid jamming up your machine)

## 2017-10-17 LAB — CALCIUM, IONIZED: Calcium, Ion: 5.2 mg/dL (ref 4.8–5.6)

## 2017-10-18 ENCOUNTER — Encounter: Payer: Self-pay | Admitting: Internal Medicine

## 2017-10-18 DIAGNOSIS — F411 Generalized anxiety disorder: Secondary | ICD-10-CM | POA: Insufficient documentation

## 2017-10-18 NOTE — Assessment & Plan Note (Addendum)
Annual comprehensive preventive exam was done as well as an evaluation and management of chronic conditions .  During the course of the visit the patient was educated and counseled about appropriate screening and preventive services including :  diabetes screening, lipid analysis with projected  10 year  risk for CAD , nutrition counseling, breast, cervical and colorectal cancer screening, and recommended immunizations.  Printed recommendations for health maintenance screenings was given.  Lab Results  Component Value Date   TSH 2.06 10/16/2017   Lab Results  Component Value Date   CHOL 147 10/16/2017   HDL 50.90 10/16/2017   LDLCALC 79 10/16/2017   TRIG 85.0 10/16/2017   CHOLHDL 3 10/16/2017   Lab Results  Component Value Date   HGBA1C 5.7 10/16/2017

## 2017-10-18 NOTE — Assessment & Plan Note (Signed)
Precipitated by father's prognosis of metastatic lung cancer. The risks and benefits of benzodiazepine use were discussed with patient today including excessive sedation leading to respiratory depression,  impaired thinking/driving, and addiction.  Patient was advised to avoid concurrent use with alcohol, to use medication only as needed and not to share with others  .

## 2017-10-18 NOTE — Assessment & Plan Note (Addendum)
I have addressed  BMI and recommended wt loss of 10% of body weight over the next 6 months using a low fat, low starch, high protein  fruit/vegetable based Mediterranean diet and 30 minutes of aerobic exercise a minimum of 5 days per week. rx for phentermine , proper use described.

## 2017-10-22 ENCOUNTER — Other Ambulatory Visit: Payer: Self-pay | Admitting: *Deleted

## 2017-10-22 MED ORDER — MELOXICAM 7.5 MG PO TABS: 7.5000 mg | ORAL_TABLET | Freq: Every day | ORAL | 0 refills | Status: DC

## 2017-11-02 ENCOUNTER — Encounter: Payer: Self-pay | Admitting: Obstetrics and Gynecology

## 2017-11-02 ENCOUNTER — Ambulatory Visit (INDEPENDENT_AMBULATORY_CARE_PROVIDER_SITE_OTHER): Payer: 59 | Admitting: Obstetrics and Gynecology

## 2017-11-02 VITALS — BP 120/80 | HR 90 | Ht 66.0 in | Wt 187.0 lb

## 2017-11-02 DIAGNOSIS — Z01419 Encounter for gynecological examination (general) (routine) without abnormal findings: Secondary | ICD-10-CM

## 2017-11-02 DIAGNOSIS — Z1239 Encounter for other screening for malignant neoplasm of breast: Secondary | ICD-10-CM

## 2017-11-02 DIAGNOSIS — Z1231 Encounter for screening mammogram for malignant neoplasm of breast: Secondary | ICD-10-CM

## 2017-11-02 DIAGNOSIS — Z803 Family history of malignant neoplasm of breast: Secondary | ICD-10-CM | POA: Diagnosis not present

## 2017-11-02 NOTE — Progress Notes (Signed)
PCP:  Crecencio Mc, MD   Chief Complaint  Patient presents with  . Gynecologic Exam     HPI:      [name] is a 51 y.o. [phone] who LMP was No LMP recorded. Patient is postmenopausal., presents today for her annual examination.  Her menses are absent, went through menopause prematurely. Did HRT for 10 yrs. She does not have intermenstrual bleeding.  Sex activity: single partner, contraception - post menopausal status.  Last Pap: October 20, 2016  Results were: no abnormalities /neg HPV DNA  Hx of STDs: none  Last mammogram: September 21, 2017  Results were: normal--routine follow-up in 12 months There is a FH of breast cancer in her mother, mat aunt, and MGM. Pt's mom is BRCA neg. Update testing not done and pt declined last yr . There is no FH of ovarian cancer. The patient does do self-breast exams.  Tobacco use: The patient denies current or previous tobacco use. Alcohol use: none No drug use.  Exercise: not active  She does get adequate calcium and Vitamin D in her diet. Labs with PCP.   Past Medical History:  Diagnosis Date  . Sinus tachycardia 2012/ 2016   Fath  . Venous insufficiency 2011   SAnkar    Past Surgical History:  Procedure Laterality Date  . 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 TONSILLECTOMY Bilateral 01/13/2000    Family History  Problem Relation Age of Onset  . Heart disease Father   . Hyperlipidemia Father   . Thyroid disease Mother   . Cancer Mother 62       Breast BRCA Negative  . Breast cancer Mother 67       BRCA neg  . Cancer Maternal Grandmother 60       breast  . Breast cancer Maternal Grandmother 68  . Cancer Maternal Aunt 66       breast  . Breast cancer Maternal Aunt 66       deceased 11/08/17  . Lung cancer Maternal Grandfather 23       smoker    Social History   Socioeconomic History  . Marital status: Married    Spouse name: Not on file  . Number of children: Not on file  .  Years of education: 59  . Highest education level: Not on file  Social Needs  . Financial resource strain: Not on file  . Food insecurity - worry: Not on file  . Food insecurity - inability: Not on file  . Transportation needs - medical: Not on file  . Transportation needs - non-medical: Not on file  Occupational History  . Occupation: registered Optician, dispensing: [employer]  Tobacco Use  . Smoking status: Never Smoker  . Smokeless tobacco: Never Used  Substance and Sexual Activity  . Alcohol use: No    Alcohol/week: 0.0 oz  . Drug use: No  . Sexual activity: Yes    Birth control/protection: Post-menopausal  Other Topics Concern  . Not on file  Social History Narrative   RN for [employer]    Current Meds  Medication Sig  . ALPRAZolam (XANAX) 0.25 MG tablet Take 1 tablet (0.25 mg total) by mouth 2 (two) times daily as needed for anxiety.  . B Complex-Biotin-FA (TH VITAMIN B 50/B-COMPLEX) TABS Take 1 tablet by mouth daily.  . calcium carbonate (OS-CAL) 600 MG TABS tablet Take 1 tablet by mouth daily.  Marland Kitchen  diphenhydrAMINE (BENADRYL) 25 mg capsule Take 1 capsule by mouth at bedtime.   . Melatonin 5 MG TABS Take 1 capsule by mouth at bedtime.  . meloxicam (MOBIC) 7.5 MG tablet Take 1 tablet (7.5 mg total) by mouth daily.  . phentermine (ADIPEX-P) 37.5 MG tablet 1/2 tablet up to twice daily before meals  . scopolamine (TRANSDERM-SCOP) 1 MG/3DAYS Place 1 patch (1.5 mg total) onto the skin every 3 (three) days.  . vitamin E 400 UNIT capsule Take 1 capsule by mouth daily.     ROS:  Review of Systems  Constitutional: Negative for fatigue, fever and unexpected weight change.  Respiratory: Negative for cough, shortness of breath and wheezing.   Cardiovascular: Negative for chest pain, palpitations and leg swelling.  Gastrointestinal: Negative for blood in stool, constipation, diarrhea, nausea and vomiting.  Endocrine: Negative for cold intolerance, heat  intolerance and polyuria.  Genitourinary: Negative for dyspareunia, dysuria, flank pain, frequency, genital sores, hematuria, menstrual problem, pelvic pain, urgency, vaginal bleeding, vaginal discharge and vaginal pain.  Musculoskeletal: Negative for back pain, joint swelling and myalgias.  Skin: Negative for rash.  Neurological: Negative for dizziness, syncope, light-headedness, numbness and headaches.  Hematological: Negative for adenopathy.  Psychiatric/Behavioral: Negative for agitation, confusion, sleep disturbance and suicidal ideas. The patient is not nervous/anxious.      Objective: BP 120/80   Pulse 90   Ht '5\' 6"'  (1.676 m)   Wt 187 lb (84.8 kg)   BMI 30.18 kg/m    Physical Exam  Constitutional: She is oriented to person, place, and time. She appears well-developed and well-nourished.  Genitourinary: Vagina normal and uterus normal. There is no rash or tenderness on the right labia. There is no rash or tenderness on the left labia. No erythema or tenderness in the vagina. No vaginal discharge found. Right adnexum does not display mass and does not display tenderness. Left adnexum does not display mass and does not display tenderness. Cervix does not exhibit motion tenderness or polyp. Uterus is not enlarged or tender.  Neck: Normal range of motion. No thyromegaly present.  Cardiovascular: Normal rate, regular rhythm and normal heart sounds.  No murmur heard. Pulmonary/Chest: Effort normal and breath sounds normal. Right breast exhibits no mass, no nipple discharge, no skin change and no tenderness. Left breast exhibits no mass, no nipple discharge, no skin change and no tenderness.  Abdominal: Soft. There is no tenderness. There is no guarding.  Musculoskeletal: Normal range of motion.  Neurological: She is alert and oriented to person, place, and time. No cranial nerve deficit.  Psychiatric: She has a normal mood and affect. Her behavior is normal.  Vitals  reviewed.   Assessment/Plan: Encounter for annual routine gynecological examination  Screening for breast cancer - Pt up to date on mammo and does with PCP  Family history of breast cancer - MyRisk update testing discussed adn pt declines.       GYN counsel breast self exam, mammography screening, menopause, adequate intake of calcium and vitamin D     F/U  Return in about 1 year (around 11/02/2018).  Alicia B. Copland, PA-C 11/02/2017 8:43 AM

## 2017-11-02 NOTE — Patient Instructions (Signed)
I value your feedback and entrusting us with your care. If you get a [facility] patient survey, I would appreciate you taking the time to let us know about your experience today. Thank you! 

## 2017-11-27 ENCOUNTER — Telehealth: Payer: Self-pay | Admitting: Internal Medicine

## 2017-11-27 NOTE — Telephone Encounter (Signed)
Pt dropped off insurance for to be filled out. Placed in Dr. Derrel Nip color folder upfront.

## 2017-11-27 NOTE — Telephone Encounter (Signed)
Placed in red folder  

## 2017-12-02 DIAGNOSIS — I471 Supraventricular tachycardia: Secondary | ICD-10-CM | POA: Diagnosis not present

## 2017-12-02 DIAGNOSIS — Z87898 Personal history of other specified conditions: Secondary | ICD-10-CM | POA: Diagnosis not present

## 2017-12-02 DIAGNOSIS — Z0279 Encounter for issue of other medical certificate: Secondary | ICD-10-CM

## 2017-12-03 NOTE — Telephone Encounter (Signed)
Form has been placed in red folder.  

## 2017-12-07 NOTE — Telephone Encounter (Signed)
The forms were to document treatment of "venous disease" which I have not treated her for.  I have only treated her for obesity,  So that is how I filled it out.  The charge is $50

## 2017-12-07 NOTE — Telephone Encounter (Signed)
Pt is aware that paperwork has been completed and ready to be picked up. Paperwork has been placed up front.

## 2017-12-25 ENCOUNTER — Other Ambulatory Visit: Payer: Self-pay | Admitting: Internal Medicine

## 2017-12-25 NOTE — Telephone Encounter (Signed)
Refilled: 10/16/2017 Last OV: 10/16/2017 Next OV: 10/18/2018

## 2017-12-28 ENCOUNTER — Other Ambulatory Visit: Payer: Self-pay | Admitting: Internal Medicine

## 2017-12-28 NOTE — Telephone Encounter (Signed)
Refilled: 12/28/2017 Last OV: 10/16/2017 Next OV: 10/18/2018

## 2017-12-28 NOTE — Telephone Encounter (Signed)
Patient has to bee seen every 3 months for phentermine refills.  I will refill for one month to give her time to make appt

## 2017-12-28 NOTE — Telephone Encounter (Signed)
Printed, signed and faxed. Spoke with pt and was able to schedule her an appt in a month with Dr. Derrel Nip. Pt is aware of appt date and time.

## 2018-01-29 ENCOUNTER — Ambulatory Visit (INDEPENDENT_AMBULATORY_CARE_PROVIDER_SITE_OTHER): Payer: 59 | Admitting: Internal Medicine

## 2018-01-29 DIAGNOSIS — Z6830 Body mass index (BMI) 30.0-30.9, adult: Secondary | ICD-10-CM | POA: Diagnosis not present

## 2018-01-29 DIAGNOSIS — E66811 Obesity, class 1: Secondary | ICD-10-CM

## 2018-01-29 DIAGNOSIS — E6609 Other obesity due to excess calories: Secondary | ICD-10-CM | POA: Diagnosis not present

## 2018-01-29 MED ORDER — PHENTERMINE HCL 37.5 MG PO TABS: ORAL_TABLET | ORAL | 1 refills | Status: DC

## 2018-01-29 MED ORDER — PHENTERMINE HCL 37.5 MG PO TABS: ORAL_TABLET | ORAL | 0 refills | Status: DC

## 2018-01-29 MED ORDER — PHENTERMINE HCL 37.5 MG PO TABS: ORAL_TABLET | ORAL | 2 refills | Status: DC

## 2018-01-29 NOTE — Patient Instructions (Addendum)
You are doing well  !   You can Continue the phentermine .  Your goal is to lose 9 lbs by your next 3 month follow up

## 2018-01-29 NOTE — Progress Notes (Signed)
Subjective:  Patient ID: Cynthia Johnson, female    DOB: 1966/09/11  Age: 52 y.o. MRN: [id number]  CC: The encounter diagnosis was Class 1 obesity due to excess calories without serious comorbidity with body mass index (BMI) of 30.0 to 30.9 in adult.  HPI Cynthia Johnson presents for follow up on weight management with phentermine. She was last seen in December ,  At which time she requested use of phentermine for appetite suppression.  Sh e has been using the medication only on the weekends .  Uses only on the weekends,  Has lost 7 lbs   Father diagnosed with metastatic lung cancer last Fall.  doing better . MRI brain normal. Refused 02  .    Sees [facility] for preventive care   Last pap dec 2017  Normal   Outpatient Medications Prior to Visit  Medication Sig Dispense Refill  . ALPRAZolam (XANAX) 0.25 MG tablet Take 1 tablet (0.25 mg total) by mouth 2 (two) times daily as needed for anxiety. 20 tablet 0  . B Complex-Biotin-FA (TH VITAMIN B 50/B-COMPLEX) TABS Take 1 tablet by mouth daily.    . calcium carbonate (OS-CAL) 600 MG TABS tablet Take 1 tablet by mouth daily.    . diphenhydrAMINE (BENADRYL) 25 mg capsule Take 1 capsule by mouth at bedtime.     . Melatonin 5 MG TABS Take 1 capsule by mouth at bedtime.    Marland Kitchen scopolamine (TRANSDERM-SCOP) 1 MG/3DAYS Place 1 patch (1.5 mg total) onto the skin every 3 (three) days. 4 patch 1  . vitamin E 400 UNIT capsule Take 1 capsule by mouth daily.    . phentermine (ADIPEX-P) 37.5 MG tablet TAKE 1/2 TABLET BY MOUTH 2 TIMES A DAY BEFORE MEALS 30 tablet 0  . meloxicam (MOBIC) 7.5 MG tablet Take 1 tablet (7.5 mg total) by mouth daily. (Patient not taking: Reported on 01/29/2018) 30 tablet 0   No facility-administered medications prior to visit.     Review of Systems;  Patient denies headache, fevers, malaise, unintentional weight loss, skin rash, eye pain, sinus congestion and sinus pain, sore throat, dysphagia,  hemoptysis , cough, dyspnea, wheezing,  chest pain, palpitations, orthopnea, edema, abdominal pain, nausea, melena, diarrhea, constipation, flank pain, dysuria, hematuria, urinary  Frequency, nocturia, numbness, tingling, seizures,  Focal weakness, Loss of consciousness,  Tremor, insomnia, depression, anxiety, and suicidal ideation.      Objective:  BP 118/78 (BP Location: Left Arm, Patient Position: Sitting, Cuff Size: Normal)   Pulse 99   Temp 98.2 F (36.8 C) (Oral)   Resp 18   Wt 180 lb 3.2 oz (81.7 kg)   SpO2 99%   BMI 29.09 kg/m   BP Readings from Last 3 Encounters:  01/29/18 118/78  11/02/17 120/80  10/16/17 96/68    Wt Readings from Last 3 Encounters:  01/29/18 180 lb 3.2 oz (81.7 kg)  11/02/17 187 lb (84.8 kg)  10/16/17 186 lb 9.6 oz (84.6 kg)    General appearance: alert, cooperative and appears stated age Ears: normal TM's and external ear canals both ears Throat: lips, mucosa, and tongue normal; teeth and gums normal Neck: no adenopathy, no carotid bruit, supple, symmetrical, trachea midline and thyroid not enlarged, symmetric, no tenderness/mass/nodules Back: symmetric, no curvature. ROM normal. No CVA tenderness. Lungs: clear to auscultation bilaterally Heart: regular rate and rhythm, S1, S2 normal, no murmur, click, rub or gallop Abdomen: soft, non-tender; bowel sounds normal; no masses,  no organomegaly Pulses: 2+ and symmetric Skin:  Skin color, texture, turgor normal. No rashes or lesions Lymph nodes: Cervical, supraclavicular, and axillary nodes normal.  Lab Results  Component Value Date   HGBA1C 5.7 10/16/2017   HGBA1C 5.8 02/08/2016   HGBA1C 5.9 02/02/2015    Lab Results  Component Value Date   CREATININE 0.91 10/16/2017   CREATININE 0.81 03/07/2016   CREATININE 0.89 02/08/2016    Lab Results  Component Value Date   WBC 4.0 02/08/2016   HGB 13.1 02/08/2016   HCT 39.8 02/08/2016   PLT 253.0 02/08/2016   GLUCOSE 97 10/16/2017   CHOL 147 10/16/2017   TRIG 85.0 10/16/2017   HDL  50.90 10/16/2017   LDLCALC 79 10/16/2017   ALT 17 10/16/2017   AST 19 10/16/2017   NA 139 10/16/2017   K 3.7 10/16/2017   CL 102 10/16/2017   CREATININE 0.91 10/16/2017   BUN 16 10/16/2017   CO2 30 10/16/2017   TSH 2.06 10/16/2017   HGBA1C 5.7 10/16/2017    Mm Screening Breast Tomo Bilateral  Result Date: 09/22/2017 CLINICAL DATA:  Screening. EXAM: 2D DIGITAL SCREENING BILATERAL MAMMOGRAM WITH CAD AND ADJUNCT TOMO COMPARISON:  Previous exam(s). ACR Breast Density Category b: There are scattered areas of fibroglandular density. FINDINGS: There are no findings suspicious for malignancy. Images were processed with CAD. IMPRESSION: No mammographic evidence of malignancy. A result letter of this screening mammogram will be mailed directly to the patient. RECOMMENDATION: Screening mammogram in one year. (Code:SM-B-01Y) BI-RADS CATEGORY  1: Negative. Electronically Signed   By: Fidela Salisbury M.D.   On: 09/22/2017 13:52    Assessment & Plan:   Problem List Items Addressed This Visit    Obesity    I have congratulated her in reduction of   BMI and encouraged  Continued weight loss with goal of 5% of body weigh over the next 3 months using phentermine for appetite suppression,  a low glycemic index diet and regular exercise a minimum of 5 days per week.  She has lowered her BMI to < 30 using phentermine on weekends only  .  Refills given for 3 months       Relevant Medications   phentermine (ADIPEX-P) 37.5 MG tablet      I am having Cynthia Johnson maintain her calcium carbonate, diphenhydrAMINE, Melatonin, TH VITAMIN B 50/B-COMPLEX, vitamin E, scopolamine, ALPRAZolam, meloxicam, and phentermine.  Meds ordered this encounter  Medications  . DISCONTD: phentermine (ADIPEX-P) 37.5 MG tablet    Sig: TAKE 1/2 TABLET BY MOUTH 2 TIMES A DAY BEFORE MEALS    Dispense:  30 tablet    Refill:  0    Refill for 30 days only.  Marland Kitchen DISCONTD: phentermine (ADIPEX-P) 37.5 MG tablet    Sig: TAKE 1/2  TABLET BY MOUTH 2 TIMES A DAY BEFORE MEALS    Dispense:  30 tablet    Refill:  2    Refill for 30 days only.  . phentermine (ADIPEX-P) 37.5 MG tablet    Sig: TAKE 1/2 TABLET BY MOUTH 2 TIMES A DAY BEFORE MEALS    Dispense:  30 tablet    Refill:  1    Medications Discontinued During This Encounter  Medication Reason  . phentermine (ADIPEX-P) 37.5 MG tablet Reorder  . phentermine (ADIPEX-P) 37.5 MG tablet Reorder  . phentermine (ADIPEX-P) 37.5 MG tablet Reorder    Follow-up: Return in about 3 months (around 05/01/2018) for weight managment .   Crecencio Mc, MD

## 2018-01-31 ENCOUNTER — Encounter: Payer: Self-pay | Admitting: Internal Medicine

## 2018-01-31 NOTE — Assessment & Plan Note (Signed)
I have congratulated her in reduction of   BMI and encouraged  Continued weight loss with goal of 5% of body weigh over the next 3 months using phentermine for appetite suppression,  a low glycemic index diet and regular exercise a minimum of 5 days per week.  She has lowered her BMI to < 30 using phentermine on weekends only  .  Refills given for 3 months

## 2018-03-28 ENCOUNTER — Telehealth: Payer: Self-pay | Admitting: Internal Medicine

## 2018-03-28 NOTE — Telephone Encounter (Signed)
I received a blank form from Ms Miskell with no cover letter,  No telephone call,  Or e mail   for FMLA. CAn you ask her to  provide detailed information regarding the request .  Her father has lung CA, that's all I know

## 2018-03-30 NOTE — Telephone Encounter (Signed)
Left message for patient to return call to office. 

## 2018-04-01 NOTE — Telephone Encounter (Signed)
Patient does not need PCP to complete patient is having her fathers pulmonologist complete. She did know you received a copy also.

## 2018-05-07 ENCOUNTER — Ambulatory Visit: Payer: 59 | Admitting: Internal Medicine

## 2018-05-11 ENCOUNTER — Encounter: Payer: Self-pay | Admitting: Internal Medicine

## 2018-05-12 ENCOUNTER — Encounter: Payer: Self-pay | Admitting: Internal Medicine

## 2018-05-14 ENCOUNTER — Encounter: Payer: Self-pay | Admitting: Internal Medicine

## 2018-05-14 ENCOUNTER — Ambulatory Visit (INDEPENDENT_AMBULATORY_CARE_PROVIDER_SITE_OTHER): Payer: 59 | Admitting: Internal Medicine

## 2018-05-14 VITALS — BP 134/90 | HR 95 | Temp 98.3°F | Ht 66.0 in | Wt 172.8 lb

## 2018-05-14 DIAGNOSIS — F4321 Adjustment disorder with depressed mood: Secondary | ICD-10-CM | POA: Diagnosis not present

## 2018-05-14 DIAGNOSIS — E663 Overweight: Secondary | ICD-10-CM

## 2018-05-14 DIAGNOSIS — R7303 Prediabetes: Secondary | ICD-10-CM

## 2018-05-14 DIAGNOSIS — E782 Mixed hyperlipidemia: Secondary | ICD-10-CM | POA: Diagnosis not present

## 2018-05-14 DIAGNOSIS — Z1239 Encounter for other screening for malignant neoplasm of breast: Secondary | ICD-10-CM

## 2018-05-14 DIAGNOSIS — Z1231 Encounter for screening mammogram for malignant neoplasm of breast: Secondary | ICD-10-CM

## 2018-05-14 LAB — COMPREHENSIVE METABOLIC PANEL
ALT: 23 U/L (ref 0–35)
AST: 21 U/L (ref 0–37)
Albumin: 4.7 g/dL (ref 3.5–5.2)
Alkaline Phosphatase: 107 U/L (ref 39–117)
BUN: 19 mg/dL (ref 6–23)
CO2: 28 mEq/L (ref 19–32)
Calcium: 10.1 mg/dL (ref 8.4–10.5)
Chloride: 105 mEq/L (ref 96–112)
Creatinine, Ser: 0.9 mg/dL (ref 0.40–1.20)
GFR: 70.39 mL/min (ref >60.00)
Glucose, Bld: 105 mg/dL — ABNORMAL HIGH (ref 70–99)
Potassium: 4.9 mEq/L (ref 3.5–5.1)
Sodium: 141 mEq/L (ref 135–145)
Total Bilirubin: 0.5 mg/dL (ref 0.2–1.2)
Total Protein: 7.8 g/dL (ref 6.0–8.3)

## 2018-05-14 LAB — LIPID PANEL
Cholesterol: 169 mg/dL (ref 0–200)
HDL: 44.5 mg/dL (ref >39.00)
LDL Cholesterol: 108 mg/dL — ABNORMAL HIGH (ref 0–99)
NonHDL: 124.67
Total CHOL/HDL Ratio: 4
Triglycerides: 85 mg/dL (ref 0.0–149.0)
VLDL: 17 mg/dL (ref 0.0–40.0)

## 2018-05-14 LAB — HEMOGLOBIN A1C: Hgb A1c MFr Bld: 5.9 % (ref 4.6–6.5)

## 2018-05-14 LAB — TSH: TSH: 1.83 u[IU]/mL (ref 0.35–4.50)

## 2018-05-14 MED ORDER — PHENTERMINE HCL 37.5 MG PO TABS: ORAL_TABLET | ORAL | 1 refills | Status: DC

## 2018-05-14 MED ORDER — PHENTERMINE HCL 37.5 MG PO TABS: ORAL_TABLET | ORAL | 2 refills | Status: DC

## 2018-05-14 NOTE — Patient Instructions (Addendum)
You have 15 more lbs to go to get your BMI < 25!   I have REFILLED  phentermine for 3 months  and a  return to see me in December. Goal weight loss is 15 lbs by then utes of exercise 5 days per week is your goal   Head[employer]  App on phone for relaxation at night

## 2018-05-14 NOTE — Progress Notes (Signed)
Subjective:  Patient ID: Cynthia Johnson, female    DOB: 10/18/1966  Age: 52 y.o. MRN: [id number]  CC: The primary encounter diagnosis was Breast cancer screening. Diagnoses of Mixed hyperlipidemia, Prediabetes, Overweight, Overweight (BMI 25.0-29.9), and Grief were also pertinent to this visit.  HPI Cynthia Johnson presents for 3 month follow up on weight management therapy with phentermine prescribed.  Patient has lost  8 lbs since her visit in March,  For a total of 15 lbs since December, when phentermine was prescribed . She is using it 3-4 days  per week including  the weekends.   Father died recently from metastatic lung cancer.  He died at home with hospice, 3 days afer discharge from hospital. She has been grieving  And having trouble sleeping using alprazolam.     Outpatient Medications Prior to Visit  Medication Sig Dispense Refill  . B Complex-Biotin-FA (TH VITAMIN B 50/B-COMPLEX) TABS Take 1 tablet by mouth daily.    . calcium carbonate (OS-CAL) 600 MG TABS tablet Take 1 tablet by mouth daily.    . diphenhydrAMINE (BENADRYL) 25 mg capsule Take 1 capsule by mouth at bedtime.     . Melatonin 5 MG TABS Take 1 capsule by mouth at bedtime.    . vitamin E 400 UNIT capsule Take 1 capsule by mouth daily.    . meloxicam (MOBIC) 7.5 MG tablet Take 1 tablet (7.5 mg total) by mouth daily. 30 tablet 0  . phentermine (ADIPEX-P) 37.5 MG tablet TAKE 1/2 TABLET BY MOUTH 2 TIMES A DAY BEFORE MEALS 30 tablet 1  . scopolamine (TRANSDERM-SCOP) 1 MG/3DAYS Place 1 patch (1.5 mg total) onto the skin every 3 (three) days. (Patient not taking: Reported on 05/14/2018) 4 patch 1  . ALPRAZolam (XANAX) 0.25 MG tablet Take 1 tablet (0.25 mg total) by mouth 2 (two) times daily as needed for anxiety. 20 tablet 0   No facility-administered medications prior to visit.     Review of Systems;  Patient denies headache, fevers, malaise, unintentional weight loss, skin rash, eye pain, sinus congestion and sinus  pain, sore throat, dysphagia,  hemoptysis , cough, dyspnea, wheezing, chest pain, palpitations, orthopnea, edema, abdominal pain, nausea, melena, diarrhea, constipation, flank pain, dysuria, hematuria, urinary  Frequency, nocturia, numbness, tingling, seizures,  Focal weakness, Loss of consciousness,  Tremor, insomnia, depression, anxiety, and suicidal ideation.      Objective:  BP 134/90   Pulse 95   Temp 98.3 F (36.8 C) (Oral)   Ht 5\' 6"  (1.676 m)   Wt 172 lb 12.8 oz (78.4 kg)   SpO2 96%   BMI 27.89 kg/m   BP Readings from Last 3 Encounters:  05/14/18 134/90  01/29/18 118/78  11/02/17 120/80    Wt Readings from Last 3 Encounters:  05/14/18 172 lb 12.8 oz (78.4 kg)  01/29/18 180 lb 3.2 oz (81.7 kg)  11/02/17 187 lb (84.8 kg)    General appearance: alert, cooperative and appears stated age Ears: normal TM's and external ear canals both ears Throat: lips, mucosa, and tongue normal; teeth and gums normal Neck: no adenopathy, no carotid bruit, supple, symmetrical, trachea midline and thyroid not enlarged, symmetric, no tenderness/mass/nodules Back: symmetric, no curvature. ROM normal. No CVA tenderness. Lungs: clear to auscultation bilaterally Heart: regular rate and rhythm, S1, S2 normal, no murmur, click, rub or gallop Abdomen: soft, non-tender; bowel sounds normal; no masses,  no organomegaly Pulses: 2+ and symmetric Skin: Skin color, texture, turgor normal. No rashes or lesions Lymph  nodes: Cervical, supraclavicular, and axillary nodes normal.  Lab Results  Component Value Date   HGBA1C 5.9 05/14/2018   HGBA1C 5.7 10/16/2017   HGBA1C 5.8 02/08/2016    Lab Results  Component Value Date   CREATININE 0.90 05/14/2018   CREATININE 0.91 10/16/2017   CREATININE 0.81 03/07/2016    Lab Results  Component Value Date   WBC 4.0 02/08/2016   HGB 13.1 02/08/2016   HCT 39.8 02/08/2016   PLT 253.0 02/08/2016   GLUCOSE 105 (H) 05/14/2018   CHOL 169 05/14/2018   TRIG 85.0  05/14/2018   HDL 44.50 05/14/2018   LDLCALC 108 (H) 05/14/2018   ALT 23 05/14/2018   AST 21 05/14/2018   NA 141 05/14/2018   K 4.9 05/14/2018   CL 105 05/14/2018   CREATININE 0.90 05/14/2018   BUN 19 05/14/2018   CO2 28 05/14/2018   TSH 1.83 05/14/2018   HGBA1C 5.9 05/14/2018    Mm Screening Breast Tomo Bilateral  Result Date: 09/22/2017 CLINICAL DATA:  Screening. EXAM: 2D DIGITAL SCREENING BILATERAL MAMMOGRAM WITH CAD AND ADJUNCT TOMO COMPARISON:  Previous exam(s). ACR Breast Density Category b: There are scattered areas of fibroglandular density. FINDINGS: There are no findings suspicious for malignancy. Images were processed with CAD. IMPRESSION: No mammographic evidence of malignancy. A result letter of this screening mammogram will be mailed directly to the patient. RECOMMENDATION: Screening mammogram in one year. (Code:SM-B-01Y) BI-RADS CATEGORY  1: Negative. Electronically Signed   By: Fidela Salisbury M.D.   On: 09/22/2017 13:52    Assessment & Plan:   Problem List Items Addressed This Visit    Grief    Patient is dealing with the loss of spouse and has adequate coping skills and emotional support .  i have asked patinet to return in one month to examine for signs of unresolving grief.        Overweight (BMI 25.0-29.9)    She is losing weight appropriately using phentermine 3-4 days per week.  Refills given        Other Visit Diagnoses    Breast cancer screening    -  Primary   Relevant Orders   MM 3D SCREEN BREAST BILATERAL   Mixed hyperlipidemia       Relevant Orders   Lipid panel (Completed)   Prediabetes       Relevant Orders   Hemoglobin A1c (Completed)   Comprehensive metabolic panel (Completed)   Overweight       Relevant Orders   TSH (Completed)    A total of 25 minutes of face to face time was spent with patient more than half of which was spent in counselling about the above mentioned conditions  and coordination of care   I have discontinued  Cynthia Johnson's ALPRAZolam and meloxicam. I am also having her maintain her calcium carbonate, diphenhydrAMINE, Melatonin, TH VITAMIN B 50/B-COMPLEX, vitamin E, scopolamine, and phentermine.  Meds ordered this encounter  Medications  . DISCONTD: phentermine (ADIPEX-P) 37.5 MG tablet    Sig: TAKE 1/2 TABLET BY MOUTH 2 TIMES A DAY BEFORE MEALS    Dispense:  30 tablet    Refill:  1  . phentermine (ADIPEX-P) 37.5 MG tablet    Sig: TAKE 1/2 TABLET BY MOUTH 2 TIMES A DAY BEFORE MEALS    Dispense:  30 tablet    Refill:  2    Medications Discontinued During This Encounter  Medication Reason  . ALPRAZolam (XANAX) 0.25 MG tablet Completed Course  . meloxicam (  MOBIC) 7.5 MG tablet   . phentermine (ADIPEX-P) 37.5 MG tablet Reorder  . phentermine (ADIPEX-P) 37.5 MG tablet Reorder    Follow-up: No follow-ups on file.   Crecencio Mc, MD

## 2018-05-16 DIAGNOSIS — F4321 Adjustment disorder with depressed mood: Secondary | ICD-10-CM | POA: Insufficient documentation

## 2018-05-16 NOTE — Assessment & Plan Note (Signed)
She is losing weight appropriately using phentermine 3-4 days per week.  Refills given  

## 2018-05-16 NOTE — Assessment & Plan Note (Signed)
Patient is dealing with the  loss of spouse and has adequate coping skills and emotional support .  i have asked patinet to return in one month to examine for signs of unresolving grief.  

## 2018-07-06 ENCOUNTER — Encounter: Payer: Self-pay | Admitting: Internal Medicine

## 2018-09-24 ENCOUNTER — Ambulatory Visit
Admission: RE | Admit: 2018-09-24 | Discharge: 2018-09-24 | Disposition: A | Discharge status: Home / Self Care | Payer: 59 | Source: Ambulatory Visit | Attending: Internal Medicine | Admitting: Internal Medicine

## 2018-09-24 DIAGNOSIS — Z1239 Encounter for other screening for malignant neoplasm of breast: Secondary | ICD-10-CM

## 2018-09-24 DIAGNOSIS — Z1231 Encounter for screening mammogram for malignant neoplasm of breast: Secondary | ICD-10-CM | POA: Diagnosis not present

## 2018-10-18 ENCOUNTER — Encounter: Payer: Self-pay | Admitting: Internal Medicine

## 2018-10-18 ENCOUNTER — Other Ambulatory Visit: Payer: Self-pay

## 2018-10-18 ENCOUNTER — Ambulatory Visit (INDEPENDENT_AMBULATORY_CARE_PROVIDER_SITE_OTHER): Payer: 59 | Admitting: Internal Medicine

## 2018-10-18 VITALS — BP 98/64 | HR 98 | Temp 97.9°F | Resp 14 | Ht 66.0 in | Wt 166.8 lb

## 2018-10-18 DIAGNOSIS — E663 Overweight: Secondary | ICD-10-CM | POA: Diagnosis not present

## 2018-10-18 DIAGNOSIS — Z0000 Encounter for general adult medical examination without abnormal findings: Secondary | ICD-10-CM

## 2018-10-18 DIAGNOSIS — F4321 Adjustment disorder with depressed mood: Secondary | ICD-10-CM

## 2018-10-18 DIAGNOSIS — Z23 Encounter for immunization: Secondary | ICD-10-CM

## 2018-10-18 DIAGNOSIS — R634 Abnormal weight loss: Secondary | ICD-10-CM

## 2018-10-18 DIAGNOSIS — F5102 Adjustment insomnia: Secondary | ICD-10-CM

## 2018-10-18 LAB — COMPREHENSIVE METABOLIC PANEL
ALT: 26 U/L (ref 0–35)
AST: 18 U/L (ref 0–37)
Albumin: 4.5 g/dL (ref 3.5–5.2)
Alkaline Phosphatase: 73 U/L (ref 39–117)
BUN: 20 mg/dL (ref 6–23)
CO2: 27 mEq/L (ref 19–32)
Calcium: 9.7 mg/dL (ref 8.4–10.5)
Chloride: 104 mEq/L (ref 96–112)
Creatinine, Ser: 0.82 mg/dL (ref 0.40–1.20)
GFR: 78.24 mL/min (ref >60.00)
Glucose, Bld: 99 mg/dL (ref 70–99)
Potassium: 4.2 mEq/L (ref 3.5–5.1)
Sodium: 138 mEq/L (ref 135–145)
Total Bilirubin: 0.4 mg/dL (ref 0.2–1.2)
Total Protein: 7 g/dL (ref 6.0–8.3)

## 2018-10-18 LAB — LIPID PANEL
Cholesterol: 116 mg/dL (ref 0–200)
HDL: 34.9 mg/dL — ABNORMAL LOW (ref >39.00)
LDL Cholesterol: 68 mg/dL (ref 0–99)
NonHDL: 81.34
Total CHOL/HDL Ratio: 3
Triglycerides: 67 mg/dL (ref 0.0–149.0)
VLDL: 13.4 mg/dL (ref 0.0–40.0)

## 2018-10-18 LAB — MAGNESIUM: Magnesium: 2.2 mg/dL (ref 1.5–2.5)

## 2018-10-18 LAB — TSH: TSH: 1.93 u[IU]/mL (ref 0.35–4.50)

## 2018-10-18 NOTE — Progress Notes (Signed)
Patient ID: Cynthia Johnson, female    DOB: 06/16/1966  Age: 52 y.o. MRN: [id number]  The patient is here for annual preventive examination and management of other chronic and acute problems.   The risk factors are reflected in the social history.  The roster of all physicians providing medical care to patient - is listed in the Snapshot section of the chart.  Activities of daily living:  The patient is 100% independent in all ADLs: dressing, toileting, feeding as well as independent mobility  Home safety : The patient has smoke detectors in the home. They wear seatbelts.  There are no firearms at home. There is no violence in the home.   There is no risks for hepatitis, STDs or HIV. There is no   history of blood transfusion. They have no travel history to infectious disease endemic areas of the world.  The patient has seen their dentist in the last six month. They have seen their eye doctor in the last year. They deny hearing difficulty with regard to whispered voices .   They do not  have excessive sun exposure. Discussed the need for sun protection: hats, long sleeves and use of sunscreen if there is significant sun exposure.   Diet: the importance of a healthy diet is discussed. They do have a healthy diet.  The benefits of regular aerobic exercise were discussed. She walks 4 times per week ,  20 minutes.   Depression screen: there are no signs or vegative symptoms of depression- irritability, change in appetite, anhedonia, sadness/tearfullness.  Cognitive assessment: the patient manages all their financial and personal affairs and is actively engaged. They could relate day,date,year and events; recalled 2/3 objects at 3 minutes; performed clock-face test normally.  The following portions of the patient's history were reviewed and updated as appropriate: allergies, current medications, past family history, past medical history,  past surgical history, past social history  and problem  list.  Visual acuity was not assessed per patient preference since she has regular follow up with her ophthalmologist. Hearing and body mass index were assessed and reviewed.   During the course of the visit the patient was educated and counseled about appropriate screening and preventive services including : fall prevention , diabetes screening, nutrition counseling, colorectal cancer screening, and recommended immunizations.    CC: The primary encounter diagnosis was Visit for preventive health examination. Diagnoses of Weight loss, Need for diphtheria-tetanus-pertussis (Tdap) vaccine, Grief, Adjustment insomnia, and Overweight (BMI 25.0-29.9) were also pertinent to this visit.  Overweight: ongoing weight loss,  Intermittent fasting several days per week.  Phentermine on the other days.  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   Insomnia: no longer taking melatonin and benadryll on a daily basis due to fear of dementia .  Taking alpha lipoic acid for neuropathy  Involving the legs,   feeling better . Still grieving loss of father 6 months ago.    History Kemyra has a past medical history of Sinus tachycardia (2012/ 2016) and Venous insufficiency (2011).   She has a past surgical history that includes Finger surgery (Left, 1999) and Tonsillectomy (Bilateral, 01/13/2000).   Her family history includes Breast cancer (age of onset: 36) in her mother; Breast cancer (age of onset: 65) in her maternal aunt; Breast cancer (age of onset: 2) in her maternal grandmother; Cancer (age of onset: 59) in her mother; Cancer (age of onset: 52) in her maternal grandmother; Cancer (age of  onset: 52) in her maternal aunt; Heart disease in her father; Hyperlipidemia in her father; Lung cancer (age of onset: 60) in her maternal grandfather; Pemphigus vulgaris in her paternal grandfather; Thyroid disease in her mother.She reports that she has never smoked. She has never used smokeless tobacco. She reports  that she does not drink alcohol or use drugs.  Outpatient Medications Prior to Visit  Medication Sig Dispense Refill  . Alpha-Lipoic Acid 100 MG CAPS Take 2 capsules by mouth 2 (two) times daily.    Marland Kitchen aspirin EC 81 MG tablet Take 81 mg by mouth daily.    . Calcium Polycarbophil (FIBER-CAPS PO) Take 1 capsule by mouth 2 (two) times daily.    . Multiple Vitamin (MULTIVITAMIN) tablet Take 1 tablet by mouth daily.    . phentermine (ADIPEX-P) 37.5 MG tablet TAKE 1/2 TABLET BY MOUTH 2 TIMES A DAY BEFORE MEALS 30 tablet 2  . scopolamine (TRANSDERM-SCOP) 1 MG/3DAYS Place 1 patch (1.5 mg total) onto the skin every 3 (three) days. (Patient not taking: Reported on 05/14/2018) 4 patch 1  . B Complex-Biotin-FA (TH VITAMIN B 50/B-COMPLEX) TABS Take 1 tablet by mouth daily.    . calcium carbonate (OS-CAL) 600 MG TABS tablet Take 1 tablet by mouth daily.    . diphenhydrAMINE (BENADRYL) 25 mg capsule Take 1 capsule by mouth at bedtime.     . Melatonin 5 MG TABS Take 1 capsule by mouth at bedtime.    . vitamin E 400 UNIT capsule Take 1 capsule by mouth daily.     No facility-administered medications prior to visit.     Review of Systems   Patient denies headache, fevers, malaise, unintentional weight loss, skin rash, eye pain, sinus congestion and sinus pain, sore throat, dysphagia,  hemoptysis , cough, dyspnea, wheezing, chest pain, palpitations, orthopnea, edema, abdominal pain, nausea, melena, diarrhea, constipation, flank pain, dysuria, hematuria, urinary  Frequency, nocturia, numbness, tingling, seizures,  Focal weakness, Loss of consciousness,  Tremor, insomnia, depression, anxiety, and suicidal ideation.      Objective:  BP 98/64 (BP Location: Left Arm, Patient Position: Sitting, Cuff Size: Normal)   Pulse 98   Temp 97.9 F (36.6 C) (Oral)   Resp 14   Ht 5\' 6"  (1.676 m)   Wt 166 lb 12.8 oz (75.7 kg)   SpO2 98%   BMI 26.92 kg/m   Physical Exam    Assessment & Plan:   Problem List Items  Addressed This Visit    Grief    Patient is dealing with the loss of her father to metastatic lung cancer over 6 months ago,  Appears to have adequate coping skills and emotional support . The holidays  Are admittedly more difficult but she declines grief counselling       Insomnia    Improved somewhat, No longer  Taking daily melatonin or benadryl      Overweight (BMI 25.0-29.9)    She is losing weight appropriately using phentermine 3-4 days per week.  Refills given       Visit for preventive health examination - Primary    age appropriate education and counseling updated, referrals for preventative services and immunizations addressed, dietary and smoking counseling addressed, most recent labs reviewed.  I have personally reviewed and have noted:  1) the patient's medical and social history 2) The pt's use of alcohol, tobacco, and illicit drugs 3) The patient's current medications and supplements 4) Functional ability including ADL's, fall risk, home safety risk, hearing and visual impairment  5) Diet and physical activities 6) Evidence for depression or mood disorder 7) The patient's height, weight, and BMI have been recorded in the chart  I have made referrals, and provided counseling and education based on review of the above       Other Visit Diagnoses    Weight loss       Relevant Orders   Lipid panel (Completed)   Comprehensive metabolic panel (Completed)   TSH (Completed)   Magnesium (Completed)   Need for diphtheria-tetanus-pertussis (Tdap) vaccine       Relevant Orders   Tdap vaccine greater than or equal to 7yo IM (Completed)      I have discontinued [name]. Olkowski's calcium carbonate, diphenhydrAMINE, Melatonin, TH VITAMIN B 50/B-COMPLEX, and vitamin E. I am also having her maintain her scopolamine, Calcium Polycarbophil (FIBER-CAPS PO), aspirin EC, multivitamin, Alpha-Lipoic Acid, and phentermine.  Meds ordered this encounter  Medications  . phentermine  (ADIPEX-P) 37.5 MG tablet    Sig: TAKE 1/2 TABLET BY MOUTH 2 TIMES A DAY BEFORE MEALS    Dispense:  30 tablet    Refill:  2    Medications Discontinued During This Encounter  Medication Reason  . diphenhydrAMINE (BENADRYL) 25 mg capsule Patient Preference  . Melatonin 5 MG TABS Patient Preference  . vitamin E 400 UNIT capsule Patient Preference  . B Complex-Biotin-FA (TH VITAMIN B 50/B-COMPLEX) TABS Patient Preference  . calcium carbonate (OS-CAL) 600 MG TABS tablet Patient Preference  . phentermine (ADIPEX-P) 37.5 MG tablet Reorder    Follow-up: No follow-ups on file.   Crecencio Mc, MD

## 2018-10-18 NOTE — Patient Instructions (Signed)
You should not take more than 2000 Ius of Vitamin D3 daily,  Because your baseline level is EXCELLENT!  You've lost 40 lbs over the past 3 years    Colon cancer screening begins at age 50.  If you change your mind about starting screening this year  , let me know  Health Maintenance, Female Adopting a healthy lifestyle and getting preventive care can go a long way to promote health and wellness. Talk with your health care provider about what schedule of regular examinations is right for you. This is a good chance for you to check in with your provider about disease prevention and staying healthy. In between checkups, there are plenty of things you can do on your own. Experts have done a lot of research about which lifestyle changes and preventive measures are most likely to keep you healthy. Ask your health care provider for more information. Weight and diet Eat a healthy diet  Be sure to include plenty of vegetables, fruits, low-fat dairy products, and lean protein.  Do not eat a lot of foods high in solid fats, added sugars, or salt.  Get regular exercise. This is one of the most important things you can do for your health. ? Most adults should exercise for at least 150 minutes each week. The exercise should increase your heart rate and make you sweat (moderate-intensity exercise). ? Most adults should also do strengthening exercises at least twice a week. This is in addition to the moderate-intensity exercise.  Maintain a healthy weight  Body mass index (BMI) is a measurement that can be used to identify possible weight problems. It estimates body fat based on height and weight. Your health care provider can help determine your BMI and help you achieve or maintain a healthy weight.  For females 82 years of age and older: ? A BMI below 18.5 is considered underweight. ? A BMI of 18.5 to 24.9 is normal. ? A BMI of 25 to 29.9 is considered overweight. ? A BMI of 30 and above is considered  obese.  Watch levels of cholesterol and blood lipids  You should start having your blood tested for lipids and cholesterol at 50 years of age, then have this test every 5 years.  You may need to have your cholesterol levels checked more often if: ? Your lipid or cholesterol levels are high. ? You are older than 50 years of age. ? You are at high risk for heart disease.  Cancer screening Lung Cancer  Lung cancer screening is recommended for adults 16-11 years old who are at high risk for lung cancer because of a history of smoking.  A yearly low-dose CT scan of the lungs is recommended for people who: ? Currently smoke. ? Have quit within the past 15 years. ? Have at least a 30-pack-year history of smoking. A pack year is smoking an average of one pack of cigarettes a day for 1 year.  Yearly screening should continue until it has been 15 years since you quit.  Yearly screening should stop if you develop a health problem that would prevent you from having lung cancer treatment.  Breast Cancer  Practice breast self-awareness. This means understanding how your breasts normally appear and feel.  It also means doing regular breast self-exams. Let your health care provider know about any changes, no matter how small.  If you are in your 20s or 30s, you should have a clinical breast exam (CBE) by a health care provider every  1-3 years as part of a regular health exam.  If you are 23 or older, have a CBE every year. Also consider having a breast X-ray (mammogram) every year.  If you have a family history of breast cancer, talk to your health care provider about genetic screening.  If you are at high risk for breast cancer, talk to your health care provider about having an MRI and a mammogram every year.  Breast cancer gene (BRCA) assessment is recommended for women who have family members with BRCA-related cancers. BRCA-related cancers  include: ? Breast. ? Ovarian. ? Tubal. ? Peritoneal cancers.  Results of the assessment will determine the need for genetic counseling and BRCA1 and BRCA2 testing.  Cervical Cancer Your health care provider may recommend that you be screened regularly for cancer of the pelvic organs (ovaries, uterus, and vagina). This screening involves a pelvic examination, including checking for microscopic changes to the surface of your cervix (Pap test). You may be encouraged to have this screening done every 3 years, beginning at age 71.  For women ages 51-65, health care providers may recommend pelvic exams and Pap testing every 3 years, or they may recommend the Pap and pelvic exam, combined with testing for human papilloma virus (HPV), every 5 years. Some types of HPV increase your risk of cervical cancer. Testing for HPV may also be done on women of any age with unclear Pap test results.  Other health care providers may not recommend any screening for nonpregnant women who are considered low risk for pelvic cancer and who do not have symptoms. Ask your health care provider if a screening pelvic exam is right for you.  If you have had past treatment for cervical cancer or a condition that could lead to cancer, you need Pap tests and screening for cancer for at least 20 years after your treatment. If Pap tests have been discontinued, your risk factors (such as having a new sexual partner) need to be reassessed to determine if screening should resume. Some women have medical problems that increase the chance of getting cervical cancer. In these cases, your health care provider may recommend more frequent screening and Pap tests.  Colorectal Cancer  This type of cancer can be detected and often prevented.  Routine colorectal cancer screening usually begins at 50 years of age and continues through 50 years of age.  Your health care provider may recommend screening at an earlier age if you have risk factors  for colon cancer.  Your health care provider may also recommend using home test kits to check for hidden blood in the stool.  A small camera at the end of a tube can be used to examine your colon directly (sigmoidoscopy or colonoscopy). This is done to check for the earliest forms of colorectal cancer.  Routine screening usually begins at age 30.  Direct examination of the colon should be repeated every 5-10 years through 50 years of age. However, you may need to be screened more often if early forms of precancerous polyps or small growths are found.  Skin Cancer  Check your skin from head to toe regularly.  Tell your health care provider about any new moles or changes in moles, especially if there is a change in a mole's shape or color.  Also tell your health care provider if you have a mole that is larger than the size of a pencil eraser.  Always use sunscreen. Apply sunscreen liberally and repeatedly throughout the day.  Protect  yourself by wearing long sleeves, pants, a wide-brimmed hat, and sunglasses whenever you are outside.  Heart disease, diabetes, and high blood pressure  High blood pressure causes heart disease and increases the risk of stroke. High blood pressure is more likely to develop in: ? People who have blood pressure in the high end of the normal range (130-139/85-89 mm Hg). ? People who are overweight or obese. ? People who are African American.  If you are 76-29 years of age, have your blood pressure checked every 3-5 years. If you are 55 years of age or older, have your blood pressure checked every year. You should have your blood pressure measured twice-once when you are at a hospital or clinic, and once when you are not at a hospital or clinic. Record the average of the two measurements. To check your blood pressure when you are not at a hospital or clinic, you can use: ? An automated blood pressure machine at a pharmacy. ? A home blood pressure monitor.  If  you are between 64 years and 74 years old, ask your health care provider if you should take aspirin to prevent strokes.  Have regular diabetes screenings. This involves taking a blood sample to check your fasting blood sugar level. ? If you are at a normal weight and have a low risk for diabetes, have this test once every three years after 50 years of age. ? If you are overweight and have a high risk for diabetes, consider being tested at a younger age or more often. Preventing infection Hepatitis B  If you have a higher risk for hepatitis B, you should be screened for this virus. You are considered at high risk for hepatitis B if: ? You were born in a country where hepatitis B is common. Ask your health care provider which countries are considered high risk. ? Your parents were born in a high-risk country, and you have not been immunized against hepatitis B (hepatitis B vaccine). ? You have HIV or AIDS. ? You use needles to inject street drugs. ? You live with someone who has hepatitis B. ? You have had sex with someone who has hepatitis B. ? You get hemodialysis treatment. ? You take certain medicines for conditions, including cancer, organ transplantation, and autoimmune conditions.  Hepatitis C  Blood testing is recommended for: ? Everyone born from [birth year] through 1963. ? Anyone with known risk factors for hepatitis C.  Sexually transmitted infections (STIs)  You should be screened for sexually transmitted infections (STIs) including gonorrhea and chlamydia if: ? You are sexually active and are younger than 50 years of age. ? You are older than 50 years of age and your health care provider tells you that you are at risk for this type of infection. ? Your sexual activity has changed since you were last screened and you are at an increased risk for chlamydia or gonorrhea. Ask your health care provider if you are at risk.  If you do not have HIV, but are at risk, it may be recommended  that you take a prescription medicine daily to prevent HIV infection. This is called pre-exposure prophylaxis (PrEP). You are considered at risk if: ? You are sexually active and do not regularly use condoms or know the HIV status of your partner(s). ? You take drugs by injection. ? You are sexually active with a partner who has HIV.  Talk with your health care provider about whether you are at high risk of being  infected with HIV. If you choose to begin PrEP, you should first be tested for HIV. You should then be tested every 3 months for as long as you are taking PrEP. Pregnancy  If you are premenopausal and you may become pregnant, ask your health care provider about preconception counseling.  If you may become pregnant, take 400 to 800 micrograms (mcg) of folic acid every day.  If you want to prevent pregnancy, talk to your health care provider about birth control (contraception). Osteoporosis and menopause  Osteoporosis is a disease in which the bones lose minerals and strength with aging. This can result in serious bone fractures. Your risk for osteoporosis can be identified using a bone density scan.  If you are 68 years of age or older, or if you are at risk for osteoporosis and fractures, ask your health care provider if you should be screened.  Ask your health care provider whether you should take a calcium or vitamin D supplement to lower your risk for osteoporosis.  Menopause may have certain physical symptoms and risks.  Hormone replacement therapy may reduce some of these symptoms and risks. Talk to your health care provider about whether hormone replacement therapy is right for you. Follow these instructions at home:  Schedule regular health, dental, and eye exams.  Stay current with your immunizations.  Do not use any tobacco products including cigarettes, chewing tobacco, or electronic cigarettes.  If you are pregnant, do not drink alcohol.  If you are  breastfeeding, limit how much and how often you drink alcohol.  Limit alcohol intake to no more than 1 drink per day for nonpregnant women. One drink equals 12 ounces of beer, 5 ounces of wine, or 1 ounces of hard liquor.  Do not use street drugs.  Do not share needles.  Ask your health care provider for help if you need support or information about quitting drugs.  Tell your health care provider if you often feel depressed.  Tell your health care provider if you have ever been abused or do not feel safe at home. This information is not intended to replace advice given to you by your health care provider. Make sure you discuss any questions you have with your health care provider. Document Released: 05/05/2011 Document Revised: 03/27/2016 Document Reviewed: 07/24/2015 Elsevier Interactive Patient Education  [employer].

## 2018-10-18 NOTE — Telephone Encounter (Signed)
Refilled: 05/14/2018 Last OV: 10/18/2018 Next OV: not scheduled

## 2018-10-19 ENCOUNTER — Encounter: Payer: Self-pay | Admitting: Internal Medicine

## 2018-10-19 MED ORDER — PHENTERMINE HCL 37.5 MG PO TABS: ORAL_TABLET | ORAL | 2 refills | Status: DC

## 2018-10-19 NOTE — Assessment & Plan Note (Signed)
She is losing weight appropriately using phentermine 3-4 days per week.  Refills given

## 2018-10-19 NOTE — Assessment & Plan Note (Signed)
Improved somewhat, No longer  Taking daily melatonin or benadryl

## 2018-10-19 NOTE — Assessment & Plan Note (Signed)

## 2018-10-19 NOTE — Telephone Encounter (Signed)
WHAT MED?  IF PHENTERMINE, IT HAS BEEN DONE

## 2018-10-19 NOTE — Assessment & Plan Note (Addendum)
Patient is dealing with the loss of her father to metastatic lung cancer over 6 months ago,  Appears to have adequate coping skills and emotional support . The holidays  Are admittedly more difficult but she declines grief counselling

## 2018-11-04 NOTE — Progress Notes (Signed)
PCP:  Crecencio Mc, MD   Chief Complaint  Patient presents with  . Gynecologic Exam     HPI:      [name] is a 53 y.o. [id number] who LMP was No LMP recorded. Patient is postmenopausal., presents today for her annual examination.  Her menses are absent, went through menopause prematurely. Did HRT for 10 yrs. She does not have intermenstrual bleeding.  Sex activity: single partner, contraception - post menopausal status. She has mild vaginal dryness, uses lubricants with relief. Last Pap: October 20, 2016  Results were: no abnormalities /neg HPV DNA  Hx of STDs: none  Last mammogram: 09/24/18 with PCP. Results were: normal--routine follow-up in 12 months There is a FH of breast cancer in her mother, mat aunt, and MGM. Pt's mom is BRCA neg. Update testing not done and pt declined past 2 yrs . There is no FH of ovarian cancer. The patient does do self-breast exams.  Tobacco use: The patient denies current or previous tobacco use. Alcohol use: occas No drug use.  Exercise: mod active  She does get adequate calcium and Vitamin D in her diet. Labs with PCP.  Colonoscopy: never; pt may want to do Cologuard  Past Medical History:  Diagnosis Date  . Family history of breast cancer    updated genetic testing declined  . Sinus tachycardia 2012/ 2016   [facility]  . Venous insufficiency 2011   SAnkar    Past Surgical History:  Procedure Laterality Date  . 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 TONSILLECTOMY Bilateral 01/13/2000    Family History  Problem Relation Age of Onset  . Heart disease Father   . Hyperlipidemia Father   . Thyroid disease Mother   . Cancer Mother 25       Breast BRCA Negative  . Breast cancer Mother 19       BRCA neg  . Hypertension Mother   . Cancer Maternal Grandmother 60       breast  . Breast cancer Maternal Grandmother 68  . Cancer Maternal Aunt 66       breast  . Breast cancer Maternal Aunt 66       deceased  15-Nov-2017  . Lung cancer Maternal Grandfather 48       smoker  . Pemphigus vulgaris Paternal Grandfather   . Hypertension Brother     Social History   Socioeconomic History  . Marital status: Married    Spouse name: Not on file  . Number of children: Not on file  . Years of education: 29  . Highest education level: Not on file  Occupational History  . Occupation: registered Optician, dispensing: [employer]  . Financial resource strain: Not on file  . Food insecurity:    Worry: Not on file    Inability: Not on file  . Transportation needs:    Medical: Not on file    Non-medical: Not on file  Tobacco Use  . Smoking status: Never Smoker  . Smokeless tobacco: Never Used  Substance and Sexual Activity  . Alcohol use: No    Alcohol/week: 0.0 standard drinks  . Drug use: No  . Sexual activity: Yes    Birth control/protection: Post-menopausal  Lifestyle  . Physical activity:    Days per week: Not on file    Minutes per session: Not on file  . Stress: Not on file  Relationships  .  Social connections:    Talks on phone: Not on file    Gets together: Not on file    Attends religious service: Not on file    Active member of club or organization: Not on file    Attends meetings of clubs or organizations: Not on file    Relationship status: Not on file  . Intimate partner violence:    Fear of current or ex partner: Not on file    Emotionally abused: Not on file    Physically abused: Not on file    Forced sexual activity: Not on file  Other Topics Concern  . Not on file  Social History Narrative   RN for [employer]    Current Meds  Medication Sig  . Alpha-Lipoic Acid 100 MG CAPS Take 2 capsules by mouth 2 (two) times daily.  . Calcium Polycarbophil (FIBER-CAPS PO) Take 1 capsule by mouth 2 (two) times daily.  . Multiple Vitamin (MULTIVITAMIN) tablet Take 1 tablet by mouth daily.  . phentermine (ADIPEX-P) 37.5 MG tablet TAKE 1/2 TABLET BY  MOUTH 2 TIMES A DAY BEFORE MEALS  . scopolamine (TRANSDERM-SCOP) 1 MG/3DAYS Place 1 patch (1.5 mg total) onto the skin every 3 (three) days.     ROS:  Review of Systems  Constitutional: Negative for fatigue, fever and unexpected weight change.  Respiratory: Negative for cough, shortness of breath and wheezing.   Cardiovascular: Negative for chest pain, palpitations and leg swelling.  Gastrointestinal: Negative for blood in stool, constipation, diarrhea, nausea and vomiting.  Endocrine: Negative for cold intolerance, heat intolerance and polyuria.  Genitourinary: Negative for dyspareunia, dysuria, flank pain, frequency, genital sores, hematuria, menstrual problem, pelvic pain, urgency, vaginal bleeding, vaginal discharge and vaginal pain.  Musculoskeletal: Negative for back pain, joint swelling and myalgias.  Skin: Negative for rash.  Neurological: Negative for dizziness, syncope, light-headedness, numbness and headaches.  Hematological: Negative for adenopathy.  Psychiatric/Behavioral: Negative for agitation, confusion, sleep disturbance and suicidal ideas. The patient is not nervous/anxious.      Objective: BP 102/70   Pulse 79   Ht _0  (1.676 m)   Wt 165 lb (74.8 kg)   BMI 26.63 kg/m    Physical Exam Constitutional:      Appearance: She is well-developed.  Genitourinary:     Vulva, vagina, cervix, uterus, right adnexa and left adnexa normal.     No vulval lesion or tenderness noted.     No vaginal discharge, erythema or tenderness.     No cervical polyp.     Uterus is not enlarged or tender.     No right or left adnexal mass present.     Right adnexa not tender.     Left adnexa not tender.  Rectum:     Guaiac result negative.     External hemorrhoid present.  Neck:     Musculoskeletal: Normal range of motion.     Thyroid: No thyromegaly.  Cardiovascular:     Rate and Rhythm: Normal rate and regular rhythm.     Heart sounds: Normal heart sounds. No murmur.    Pulmonary:     Effort: Pulmonary effort is normal.     Breath sounds: Normal breath sounds.  Chest:     Breasts:        Right: No mass, nipple discharge, skin change or tenderness.        Left: No mass, nipple discharge, skin change or tenderness.  Abdominal:     Palpations: Abdomen is soft.  Tenderness: There is no abdominal tenderness. There is no guarding.  Musculoskeletal: Normal range of motion.  Neurological:     Mental Status: She is alert and oriented to person, place, and time.     Cranial Nerves: No cranial nerve deficit.  Psychiatric:        Behavior: Behavior normal.  Vitals signs reviewed.     Assessment/Plan: Encounter for annual routine gynecological examination  Screening for breast cancer - Pt current on mammo. Does with PCP  Family history of breast cancer - MyRisk testing discussed. Pt declines.   Screening for colon cancer - Neg FOBT. Discussed GI ref for scr colonosocpy vs Cologuard. Pt to consider and f/u prn decision. May do through PCP. - Plan: POCT Occult Blood Stool      GYN counsel breast self exam, mammography screening, menopause, adequate intake of calcium and vitamin D     F/U  Return in about 1 year (around 11/06/2019).  Rewa Weissberg B. Hema Lanza, PA-C 11/05/2018 8:39 AM

## 2018-11-04 NOTE — Patient Instructions (Signed)
I value your feedback and entrusting us with your care. If you get a [facility] patient survey, I would appreciate you taking the time to let us know about your experience today. Thank you! 

## 2018-11-05 ENCOUNTER — Ambulatory Visit (INDEPENDENT_AMBULATORY_CARE_PROVIDER_SITE_OTHER): Payer: 59 | Admitting: Obstetrics and Gynecology

## 2018-11-05 ENCOUNTER — Encounter: Payer: Self-pay | Admitting: Obstetrics and Gynecology

## 2018-11-05 VITALS — BP 102/70 | HR 79 | Ht 66.0 in | Wt 165.0 lb

## 2018-11-05 DIAGNOSIS — Z1239 Encounter for other screening for malignant neoplasm of breast: Secondary | ICD-10-CM

## 2018-11-05 DIAGNOSIS — Z803 Family history of malignant neoplasm of breast: Secondary | ICD-10-CM

## 2018-11-05 DIAGNOSIS — Z01419 Encounter for gynecological examination (general) (routine) without abnormal findings: Secondary | ICD-10-CM | POA: Diagnosis not present

## 2018-11-05 DIAGNOSIS — Z1211 Encounter for screening for malignant neoplasm of colon: Secondary | ICD-10-CM

## 2018-11-05 LAB — HEMOCCULT GUIAC POC 1CARD (OFFICE): Fecal Occult Blood, POC: NEGATIVE

## 2019-06-08 ENCOUNTER — Other Ambulatory Visit: Payer: Self-pay | Admitting: Internal Medicine

## 2019-06-08 NOTE — Telephone Encounter (Signed)
Needs an RN visit for weight check. No refills until then

## 2019-06-08 NOTE — Telephone Encounter (Signed)
Refilled: 10/19/2018 Last OV: 10/18/2018 Next OV: not scheduled

## 2019-06-09 NOTE — Telephone Encounter (Signed)
Pt has been scheduled for a nurse visit on 06/30/2019.

## 2019-06-30 ENCOUNTER — Telehealth: Payer: Self-pay

## 2019-06-30 ENCOUNTER — Other Ambulatory Visit: Payer: Self-pay

## 2019-06-30 ENCOUNTER — Ambulatory Visit (INDEPENDENT_AMBULATORY_CARE_PROVIDER_SITE_OTHER): Payer: 59

## 2019-06-30 VITALS — Ht 65.35 in | Wt 173.4 lb

## 2019-06-30 DIAGNOSIS — Z6830 Body mass index (BMI) 30.0-30.9, adult: Secondary | ICD-10-CM | POA: Diagnosis not present

## 2019-06-30 DIAGNOSIS — E6609 Other obesity due to excess calories: Secondary | ICD-10-CM | POA: Diagnosis not present

## 2019-06-30 DIAGNOSIS — F4321 Adjustment disorder with depressed mood: Secondary | ICD-10-CM

## 2019-06-30 NOTE — Progress Notes (Addendum)
Patient presented today for weight check per MD order (see MyChart message on 06/08/19).  Weight- 173.4 lbs  Height- 5'5.35" (1.66 m)    I have reviewed the above information and agree with above.   Deborra Medina, MD

## 2019-06-30 NOTE — Telephone Encounter (Signed)
Patient presented today for a nurse visit.  Patient wanted me to inform Dr. Derrel Nip that patient's mother passed away about a month ago.

## 2019-06-30 NOTE — Telephone Encounter (Signed)
Thanks, Butch Penny,  I have spoken with her.

## 2019-06-30 NOTE — Assessment & Plan Note (Addendum)
After losing her father in June 2019 to [place],  Her mother  Died July 2020 [place] IN MARCH 2020 . 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  Hospice had been involved for the last 2 weeks of her life.

## 2019-08-22 ENCOUNTER — Other Ambulatory Visit: Payer: Self-pay | Admitting: Internal Medicine

## 2019-08-22 DIAGNOSIS — Z1231 Encounter for screening mammogram for malignant neoplasm of breast: Secondary | ICD-10-CM

## 2019-09-26 ENCOUNTER — Ambulatory Visit
Admission: RE | Admit: 2019-09-26 | Discharge: 2019-09-26 | Disposition: A | Discharge status: Home / Self Care | Payer: 59 | Source: Ambulatory Visit | Attending: Internal Medicine | Admitting: Internal Medicine

## 2019-09-26 ENCOUNTER — Other Ambulatory Visit: Payer: Self-pay

## 2019-09-26 DIAGNOSIS — Z1231 Encounter for screening mammogram for malignant neoplasm of breast: Secondary | ICD-10-CM | POA: Insufficient documentation

## 2019-10-26 ENCOUNTER — Ambulatory Visit: Payer: 59 | Attending: Internal Medicine

## 2019-10-26 DIAGNOSIS — Z20822 Contact with and (suspected) exposure to covid-19: Secondary | ICD-10-CM

## 2019-10-26 DIAGNOSIS — Z20828 Contact with and (suspected) exposure to other viral communicable diseases: Secondary | ICD-10-CM | POA: Diagnosis not present

## 2019-10-27 LAB — NOVEL CORONAVIRUS, NAA: SARS-CoV-2, NAA: NOT DETECTED

## 2019-10-31 ENCOUNTER — Telehealth: Payer: Self-pay | Admitting: Internal Medicine

## 2019-10-31 NOTE — Telephone Encounter (Signed)
Patient called to receive her negative COVID test results.Patient expressed understanding. °

## 2019-11-02 ENCOUNTER — Other Ambulatory Visit: Payer: 59

## 2020-02-29 DIAGNOSIS — M79601 Pain in right arm: Secondary | ICD-10-CM | POA: Diagnosis not present

## 2020-03-02 DIAGNOSIS — D2372 Other benign neoplasm of skin of left lower limb, including hip: Secondary | ICD-10-CM | POA: Diagnosis not present

## 2020-03-02 DIAGNOSIS — L821 Other seborrheic keratosis: Secondary | ICD-10-CM | POA: Diagnosis not present

## 2020-03-02 DIAGNOSIS — L570 Actinic keratosis: Secondary | ICD-10-CM | POA: Diagnosis not present

## 2020-03-02 DIAGNOSIS — D485 Neoplasm of uncertain behavior of skin: Secondary | ICD-10-CM | POA: Diagnosis not present

## 2020-03-21 DIAGNOSIS — L570 Actinic keratosis: Secondary | ICD-10-CM | POA: Diagnosis not present

## 2020-04-06 DIAGNOSIS — S20329A Blister (nonthermal) of unspecified front wall of thorax, initial encounter: Secondary | ICD-10-CM | POA: Diagnosis not present

## 2020-04-18 DIAGNOSIS — S46911A Strain of unspecified muscle, fascia and tendon at shoulder and upper arm level, right arm, initial encounter: Secondary | ICD-10-CM | POA: Diagnosis not present

## 2020-06-27 ENCOUNTER — Encounter: Payer: Self-pay | Admitting: Physical Therapy

## 2020-06-27 ENCOUNTER — Other Ambulatory Visit: Payer: Self-pay

## 2020-06-27 ENCOUNTER — Ambulatory Visit: Payer: 59 | Attending: Orthopedic Surgery | Admitting: Physical Therapy

## 2020-06-27 DIAGNOSIS — G8929 Other chronic pain: Secondary | ICD-10-CM | POA: Diagnosis not present

## 2020-06-27 DIAGNOSIS — M25511 Pain in right shoulder: Secondary | ICD-10-CM | POA: Diagnosis not present

## 2020-06-27 DIAGNOSIS — R293 Abnormal posture: Secondary | ICD-10-CM | POA: Diagnosis not present

## 2020-06-27 DIAGNOSIS — M25611 Stiffness of right shoulder, not elsewhere classified: Secondary | ICD-10-CM | POA: Diagnosis not present

## 2020-06-27 NOTE — Therapy (Signed)
[facility] [address], [place], [address] Phone: [phone]   Fax:  [phone]  Physical Therapy Evaluation  Patient Details  Name: Cynthia Johnson MRN: [id number] Date of Birth: 01-12-1966 No data recorded  Encounter Date: 06/27/2020    Past Medical History:  Diagnosis Date  . Family history of breast cancer    updated genetic testing declined  . Sinus tachycardia 2012/ 2016   Fath  . Venous insufficiency 2011   SAnkar    Past Surgical History:  Procedure Laterality Date  . 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 TONSILLECTOMY Bilateral 01/13/2000    There were no vitals filed for this visit.   Dominant hand: Right  OBJECTIVE  MUSCULOSKELETAL: Tremor: Normal Bulk: Normal Tone: Normal  Cervical Screen AROM: WFL and painless with overpressure in all planes Spurlings A (ipsilateral lateral flexion/axial compression): Negative  Spurlings B (ipsilateral lateral flexion/contralateral rotation/axial compression): Negative  Repeated movement: No centralization or peripheralization with protraction or retraction  Elbow Screen Elbow AROM: WNL bilat  Palpation Trigger points with TTP at bicep muscle belly (patient reports concordant pain sign; also concordant sign to palpation at distal tricep insertion. TTP with grimace to palpation over proximal bicep insertion. Latent trigger points and pain over infraspinatus near insertion  Posture: forward head, rounded shoulders, increased thoracic kyphosis  Strength R/L 5/5 Shoulder flexion (anterior deltoid/pec major/coracobrachialis, axillary n. (C5-6) and musculocutaneous n. (C5-7)) 5/5 Shoulder abduction (deltoid/supraspinatus, axillary/suprascapular n, C5) 5/5 Shoulder external rotation (infraspinatus/teres minor) 5/5 Shoulder internal rotation (subcapularis/lats/pec major) 5/5 Shoulder extension (posterior deltoid, lats,  teres major, axillary/thoracodorsal n.) 5/5 Latissimus 3+/4- Y lower trap 3+/4- T scapular retractors  AROM R/L 180/180 Shoulder flexion 180/180 Shoulder abduction C8/C8 Shoulder external rotation T12/T5* Shoulder internal rotation 60/60 Shoulder extension *Indicates pain, overpressure performed unless otherwise indicated  PROM R/L 180/180 Shoulder flexion 180/180 Shoulder abduction 90/80 Shoulder external rotation 70/30 Shoulder internal rotation 60/60 Shoulder extension *Indicates pain, overpressure performed unless otherwise indicated  Accessory Motions/Glides Glenohumeral: All motions normal end feels, some guarding throughout  Acromioclavicular:  All motions with normal end feels  Sternoclavicular: All motions with normal end feels  Muscle Length Testing Pectoralis Major: R: Shortened bilat Pectoralis Minor: Shortened bilat Biceps: Shortened on RUE  NEUROLOGICAL:  Mental Status Patient is oriented to person, place and time.  Recent memory is intact.  Remote memory is intact.  Attention span and concentration are intact.  Expressive speech is intact.  Patient's fund of knowledge is within normal limits for educational level.  Sensation Grossly intact to light touch bilateral UE as determined by testing dermatomes C2-T2 Proprioception and hot/cold testing deferred on this date  SPECIAL TESTS  Rotator Cuff  Drop Arm Test: Negative bilat Painful Arc (Pain from 60 to 120 degrees scaption): Negative bilat Infraspinatus Muscle Test: Negative bilat  Subacromial Impingement Hawkins-Kennedy: Positive RUE Neer (Block scapula, PROM flexion): Negative bilat Painful Arc (Pain from 60 to 120 degrees scaption): Negative bilat Empty Can: Negative bilat External Rotation Resistance: Negative bilat Horizontal Adduction: Negative bilat Scapular Assist:Positive   Labral Tear Biceps Load II (120 elevation, full ER, 90 elbow flexion, full supination, resisted elbow  flexion):Negative bilat Crank (160 scaption, axial load with IR/ER): Negative bilat Active Compression Test: Negative bilat  Bicep Tendon Pathology Speed (shoulder flexion to 90, external rotation, full elbow extension, and forearm supination with resistance: Positive RUE Yergason's (resisted shoulder ER and supination/biceps tendon pathology): Negative bilat  Shoulder Instability Sulcus Sign: Negative bilat Anterior Apprehension: Negative bilat   Ther-Ex PT reviewed the following HEP with patient with patient able to demonstrate a set of the following with min cuing for correction needed. PT educated patient on parameters of therex (how/when to inc/decrease intensity, frequency, rep/set range, stretch hold time, and purpose of therex) with verbalized understanding. Education on neutral posture and length-tension relationship needed to establish efficient mobility with anatomical model used to aid in understanding; verbalized understanding Access Code: [email or web address] Seated Scapular Retraction - 8 x daily - 7 x weekly - 10 reps - 2sec hold Bicep Stretch at Table - 3 x daily - 7 x weekly - 30sec hold               Objective measurements completed on examination: See above findings.                             Patient will benefit from skilled therapeutic intervention in order to improve the following deficits and impairments:     Visit Diagnosis: No diagnosis found.     Problem List Patient Active Problem List   Diagnosis Date Noted  . Grief 05/16/2018  . Visit for preventive health examination 02/10/2016  . SVT (supraventricular tachycardia) ([place]) 02/04/2015  . Overweight (BMI 25.0-29.9) 02/04/2015  . Premature menopause 02/04/2015  . Insomnia 02/04/2015    Durwin Reges 06/27/2020, 8:10 AM  [facility] [address] [place], [address] Phone: [phone]   Fax:   [phone]  Name: SHARLENA KRISTENSEN MRN: [id number] Date of Birth: [birth year]

## 2020-06-29 ENCOUNTER — Other Ambulatory Visit: Payer: Self-pay

## 2020-06-29 ENCOUNTER — Encounter: Payer: Self-pay | Admitting: Physical Therapy

## 2020-06-29 ENCOUNTER — Ambulatory Visit: Payer: 59 | Admitting: Physical Therapy

## 2020-06-29 DIAGNOSIS — M25611 Stiffness of right shoulder, not elsewhere classified: Secondary | ICD-10-CM | POA: Diagnosis not present

## 2020-06-29 DIAGNOSIS — R293 Abnormal posture: Secondary | ICD-10-CM

## 2020-06-29 DIAGNOSIS — G8929 Other chronic pain: Secondary | ICD-10-CM

## 2020-06-29 DIAGNOSIS — M25511 Pain in right shoulder: Secondary | ICD-10-CM | POA: Diagnosis not present

## 2020-06-29 NOTE — Therapy (Signed)
[facility] [address] [place], [address] Phone: [phone]   Fax:  [phone]  Physical Therapy Treatment  Patient Details  Name: Cynthia Johnson MRN: [id number] Date of Birth: 1966-03-24 No data recorded  Encounter Date: 06/29/2020   PT End of Session - 06/29/20 1031    Visit Number 2    Number of Visits 17    Date for PT Re-Evaluation 08/24/20    PT Start Time 1006    PT Stop Time 1045    PT Time Calculation (min) 39 min    Activity Tolerance Patient tolerated treatment well    Behavior During Therapy [facility] for tasks assessed/performed           Past Medical History:  Diagnosis Date  . Family history of breast cancer    updated genetic testing declined  . Sinus tachycardia 2012/ 2016   Fath  . Venous insufficiency 2011   SAnkar    Past Surgical History:  Procedure Laterality Date  . 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 TONSILLECTOMY Bilateral 01/13/2000    There were no vitals filed for this visit.   Subjective Assessment - 06/29/20 1008    Subjective Patient reports that she has been icing her shoulder, and her pain has not been as noticable with this. Reports no pain this am, only with advert motions.    Pertinent History Pt is a 54 year old female presenting with R upper arm pain since March. 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 Reports a catching sensation at tricep and bicep that comes on quickly and goes away quickly with reaching forward, and back behind her; reports no audible popping/clicking. Denies radiating pain, or sensation changes. Reports this pain is 5/10 when it comes on and no pain at rest. 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 Pt is an avid gardener and has difficulty weedeating for a  long time.  Pt denies N/V, B&B changes, unexplained weight fluctuation, saddle paresthesia, fever, night sweats, or unrelenting night pain at this time.    Limitations Lifting;House hold activities    How long can you sit comfortably? unlimited    How long can you stand comfortably? unlimited    How long can you walk comfortably? unlimited    Diagnostic tests March 2021    Pain Onset More than a month ago             Manual STM with trigger point release to bicep muscle group, tricep, pec minor Following: Dry Needling: (3/2) 77mm .25 needles placed along the R Biceps and tricep mid muscle belly to decrease increased muscular spasms and trigger points with the patient positioned in supine and prone with pincer grasp. Patient was educated on risks and benefits of therapy and verbally consents to PT.     Ther-Ex Supine on foam pec/bicep stretch 54min  AAROM IR x12 2-3sec; cuing to complete in pain free range Band pull aparts GTB 2x 10 with cuing for scapular retraction with good carry over Bilat shoulder ER + scapular retraction GTB 2x 10 with cuing to maintain elbow position with good carry over  Scaption with GTB tension 2x 8 with cuing for proper technique initially with good carry over  PT Short Term Goals - 06/27/20 0931      PT SHORT TERM GOAL #1   Title Pt will be independent with HEP in order to improve strength and decrease pain in order to improve pain-free function at home and work.    Baseline 06/27/20 HEP given    Time 4    Period Weeks    Status New             PT Long Term Goals - 06/27/20 0932      PT LONG TERM GOAL #1   Title Patient will increase FOTO score to 79 to demonstrate predicted increase in functional mobility to complete ADLs    Baseline 06/27/20 52    Time 8    Period Weeks    Status New      PT LONG TERM GOAL #2   Title Pt will decrease worst pain as reported on NPRS by at least 3 points in order to  demonstrate clinically significant reduction in pain.    Baseline 06/27/20 5/10 pain with catching    Time 8    Period Weeks    Status New      PT LONG TERM GOAL #3   Title Pt will demonstrate R shoulder active apley's IR to at least T7 in order to don/doff bra    Baseline 06/27/20 T12 with pain    Time 8    Period Weeks    Status New      PT LONG TERM GOAL #4   Title Pt will increase periscapular strength of  by at least 1/2 MMT grade in order to demonstrate improvement in strength and function    Baseline 06/27/20 R/L Y lower trap 3+/4-; T scapular retractors 3+/4-    Time 8    Period Weeks    Status New                 Plan - 06/29/20 1110    Clinical Impression Statement PT utilized manual techniques and TDN to decrease muscle tension and pain with success, full PROM following, and increased AROM. Patient is able to comply with multi-modal cuing for proper technique of therex and posture with good understanding of  posture. PT will continue progression as able.    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;Occupation;Yard Work    [employer]chant [employer] officer Evolving/Moderate complexity    Clinical Decision Making Moderate    Rehab Potential Good    PT Frequency 2x / week    PT Duration 8 weeks    PT Treatment/Interventions Electrical Stimulation;Traction;Ultrasound;Therapeutic activities;Neuromuscular re-education;Manual techniques;Joint Manipulations;Spinal Manipulations;Taping;Passive range of motion;Dry needling;Patient/family education;Balance training;Therapeutic exercise;Functional mobility training;Moist Heat;Cryotherapy;Iontophoresis 4mg /ml Dexamethasone;ADLs/Self Care Home Management    PT Next Visit Plan TDN? manual techniques,  postural restoration (periscapular activation)    PT Home Exercise Plan scap retractions, bicep stretch    Consulted and Agree with Plan of Care Patient           Patient will benefit from skilled therapeutic intervention in order to improve the following deficits and impairments:  Decreased activity tolerance, Decreased endurance, Decreased range of motion, Decreased mobility, Decreased coordination, Increased muscle spasms, Impaired flexibility, Postural dysfunction, Impaired tone, Decreased strength, Increased fascial restricitons, Impaired UE functional use, Improper body mechanics, Pain  Visit Diagnosis: Chronic right shoulder pain  Stiffness of right shoulder, not elsewhere classified  Abnormal posture  Problem List Patient Active Problem List   Diagnosis Date Noted  . Grief 05/16/2018  . Visit for preventive health examination 02/10/2016  . SVT (supraventricular tachycardia) ([place]) 02/04/2015  . Overweight (BMI 25.0-29.9) 02/04/2015  . Premature menopause 02/04/2015  . Insomnia 02/04/2015   Durwin Reges DPT Durwin Reges 06/29/2020, 11:16 AM  [facility] [address] [place], [address] Phone: [phone]   Fax:  [phone]  Name: Cynthia Johnson MRN: [id number] Date of Birth: 07/12/1966

## 2020-07-04 ENCOUNTER — Ambulatory Visit: Payer: 59 | Attending: Orthopedic Surgery | Admitting: Physical Therapy

## 2020-07-04 ENCOUNTER — Encounter: Payer: Self-pay | Admitting: Physical Therapy

## 2020-07-04 ENCOUNTER — Other Ambulatory Visit: Payer: Self-pay

## 2020-07-04 DIAGNOSIS — M25611 Stiffness of right shoulder, not elsewhere classified: Secondary | ICD-10-CM | POA: Insufficient documentation

## 2020-07-04 DIAGNOSIS — G8929 Other chronic pain: Secondary | ICD-10-CM | POA: Diagnosis not present

## 2020-07-04 DIAGNOSIS — M25511 Pain in right shoulder: Secondary | ICD-10-CM | POA: Insufficient documentation

## 2020-07-04 DIAGNOSIS — R293 Abnormal posture: Secondary | ICD-10-CM | POA: Diagnosis not present

## 2020-07-04 NOTE — Therapy (Signed)
[facility] [address] [place], [address] Phone: [phone]   Fax:  [phone]  Physical Therapy Treatment  Patient Details  Name: Cynthia Johnson MRN: [id number] Date of Birth: 01/13/1966 No data recorded  Encounter Date: 07/04/2020   PT End of Session - 07/04/20 0924    Visit Number 3    Number of Visits 17    Date for PT Re-Evaluation 08/24/20    PT Start Time 0903    PT Stop Time 0943    PT Time Calculation (min) 40 min    Activity Tolerance Patient tolerated treatment well    Behavior During Therapy [facility] for tasks assessed/performed           Past Medical History:  Diagnosis Date  . Family history of breast cancer    updated genetic testing declined  . Sinus tachycardia 2012/ 2016   Fath  . Venous insufficiency 2011   SAnkar    Past Surgical History:  Procedure Laterality Date  . 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 TONSILLECTOMY Bilateral 01/13/2000    There were no vitals filed for this visit.   Subjective Assessment - 07/04/20 0903    Subjective Pt reports she felt much better following TDN, and is still feeling pretty good. Reports the AAROM IR with towel is most difficult. Reports her pain is much less frequent following TDN.    Pertinent History Pt is a 54 year old female presenting with R upper arm pain since March. 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 Reports a catching sensation at tricep and bicep that comes on quickly and goes away quickly with reaching forward, and back behind her; reports no audible popping/clicking. Denies radiating pain, or sensation changes. Reports this pain is 5/10 when it comes on and no pain at rest. 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 Pt is an avid gardener and has  difficulty weedeating for a long time.  Pt denies N/V, B&B changes, unexplained weight fluctuation, saddle paresthesia, fever, night sweats, or unrelenting night pain at this time.    Limitations Lifting;House hold activities    How long can you sit comfortably? unlimited    How long can you stand comfortably? unlimited    How long can you walk comfortably? unlimited    Diagnostic tests March 2021    Pain Onset More than a month ago            Manual STM with trigger point release to bicep muscle group,  UT, pec minor, and latissimus Following: Dry Needling: (3/2) 71mm .25 needles placed along the R Biceps and tricep mid muscle belly to decrease increased muscular spasms and trigger points with the patient positioned in supine and prone with pincer grasp. Patient was educated on risks and benefits of therapy and verbally consents to PT.     Ther-Ex Sleeper stretch 10sec hold 6x with cuing for set up with good carry over following AAROM IR x12 2-3sec; cuing to complete in pain free range Lat pulldown 20# x10 25# x10; 35# x10 with cuing to prevent shoulder hiking with good carry over OMEGA standing rows 20# 3x 10 with min cuing for proper technique following cuing for scapular retraction  Y on wall x10 with good carry over of demo; with 3# DB  2x 10 with good carry over of previous  Scaption with GTB tension 2x 8 with min correction needed       PT Education - 07/04/20 0924    Education Details therex form/techinque    Person(s) Educated Patient    Methods Explanation;Demonstration;Verbal cues    Comprehension Verbalized understanding;Returned demonstration;Verbal cues required            PT Short Term Goals - 06/27/20 0931      PT SHORT TERM GOAL #1   Title Pt will be independent with HEP in order to improve strength and decrease pain in order to improve pain-free function at home and work.    Baseline 06/27/20 HEP given    Time 4    Period Weeks    Status New              PT Long Term Goals - 06/27/20 0932      PT LONG TERM GOAL #1   Title Patient will increase FOTO score to 79 to demonstrate predicted increase in functional mobility to complete ADLs    Baseline 06/27/20 52    Time 8    Period Weeks    Status New      PT LONG TERM GOAL #2   Title Pt will decrease worst pain as reported on NPRS by at least 3 points in order to demonstrate clinically significant reduction in pain.    Baseline 06/27/20 5/10 pain with catching    Time 8    Period Weeks    Status New      PT LONG TERM GOAL #3   Title Pt will demonstrate R shoulder active apley's IR to at least T7 in order to don/doff bra    Baseline 06/27/20 T12 with pain    Time 8    Period Weeks    Status New      PT LONG TERM GOAL #4   Title Pt will increase periscapular strength of  by at least 1/2 MMT grade in order to demonstrate improvement in strength and function    Baseline 06/27/20 R/L Y lower trap 3+/4-; T scapular retractors 3+/4-    Time 8    Period Weeks    Status New                 Plan - 07/04/20 0160    Clinical Impression Statement PT continued to utlize manual and TDN techniques to relieve muscle tension and pain with success. Patient with localized twitch response and increased mobility following. PT continued therex progression for increased periscapular strength and neutral posture with success. Patient is able to complete all therex with proper technique following multimodal cuing, with good motivation throughout session, no increased pain. PT will continue progression as able.    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;Occupation;Yard Work    [employer] officer Evolving/Moderate complexity    Clinical Decision  Making Moderate    Rehab Potential Good    PT Frequency 2x / week    PT Duration 8 weeks    PT Treatment/Interventions Electrical Stimulation;Traction;Ultrasound;Therapeutic activities;Neuromuscular re-education;Manual techniques;Joint Manipulations;Spinal Manipulations;Taping;Passive range of motion;Dry needling;Patient/family education;Balance training;Therapeutic exercise;Functional mobility training;Moist Heat;Cryotherapy;Iontophoresis 4mg /ml Dexamethasone;ADLs/Self Care Home Management    PT Next Visit Plan TDN? manual techniques, postural restoration (periscapular activation)    PT Home Exercise Plan scap retractions, bicep stretch    Consulted and Agree with Plan  of Care Patient           Patient will benefit from skilled therapeutic intervention in order to improve the following deficits and impairments:  Decreased activity tolerance, Decreased endurance, Decreased range of motion, Decreased mobility, Decreased coordination, Increased muscle spasms, Impaired flexibility, Postural dysfunction, Impaired tone, Decreased strength, Increased fascial restricitons, Impaired UE functional use, Improper body mechanics, Pain  Visit Diagnosis: Chronic right shoulder pain  Stiffness of right shoulder, not elsewhere classified  Abnormal posture     Problem List Patient Active Problem List   Diagnosis Date Noted  . Grief 05/16/2018  . Visit for preventive health examination 02/10/2016  . SVT (supraventricular tachycardia) ([place]) 02/04/2015  . Overweight (BMI 25.0-29.9) 02/04/2015  . Premature menopause 02/04/2015  . Insomnia 02/04/2015   Durwin Reges DPT Durwin Reges 07/04/2020, 9:40 AM  [facility] [address] [place], [address] Phone: [phone]   Fax:  [phone]  Name: Cynthia Johnson MRN: [id number] Date of Birth: February 23, 1966

## 2020-07-06 ENCOUNTER — Encounter: Payer: Self-pay | Admitting: Physical Therapy

## 2020-07-06 ENCOUNTER — Ambulatory Visit: Payer: 59 | Admitting: Physical Therapy

## 2020-07-06 ENCOUNTER — Other Ambulatory Visit: Payer: Self-pay

## 2020-07-06 DIAGNOSIS — M25611 Stiffness of right shoulder, not elsewhere classified: Secondary | ICD-10-CM | POA: Diagnosis not present

## 2020-07-06 DIAGNOSIS — R293 Abnormal posture: Secondary | ICD-10-CM

## 2020-07-06 DIAGNOSIS — M25511 Pain in right shoulder: Secondary | ICD-10-CM

## 2020-07-06 DIAGNOSIS — G8929 Other chronic pain: Secondary | ICD-10-CM

## 2020-07-06 NOTE — Therapy (Signed)
[facility] [address], [place], [address] Phone: [phone]   Fax:  [phone]  Physical Therapy Treatment  Patient Details  Name: Cynthia Johnson MRN: [id number] Date of Birth: May 01, 1966 No data recorded  Encounter Date: 07/06/2020   PT End of Session - 07/06/20 1008    Visit Number 4    Number of Visits 17    Date for PT Re-Evaluation 08/24/20    PT Start Time 1005    PT Stop Time 1045    PT Time Calculation (min) 40 min    Activity Tolerance Patient tolerated treatment well    Behavior During Therapy [facility] for tasks assessed/performed           Past Medical History:  Diagnosis Date  . Family history of breast cancer    updated genetic testing declined  . Sinus tachycardia 2012/ 2016   Fath  . Venous insufficiency 2011   SAnkar    Past Surgical History:  Procedure Laterality Date  . 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 TONSILLECTOMY Bilateral 01/13/2000    There were no vitals filed for this visit.   Subjective Assessment - 07/06/20 1006    Subjective Pt reports pain 1x over the past 2 days, reporting that she was reaching under her son's truck. She reports she is not sore following TDN. Compliance with HEP.    Pertinent History Pt is a 54 year old female presenting with R upper arm pain since March. 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 Reports a catching sensation at tricep and bicep that comes on quickly and goes away quickly with reaching forward, and back behind her; reports no audible popping/clicking. Denies radiating pain, or sensation changes. Reports this pain is 5/10 when it comes on and no pain at rest. 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 Pt is an avid gardener and has difficulty weedeating for  a long time.  Pt denies N/V, B&B changes, unexplained weight fluctuation, saddle paresthesia, fever, night sweats, or unrelenting night pain at this time.    Limitations Lifting;House hold activities    How long can you sit comfortably? unlimited    How long can you stand comfortably? unlimited    How long can you walk comfortably? unlimited    Diagnostic tests March 2021    Pain Onset More than a month ago         Prior to manual techniques: ER approx 70d with severe pain IR approx 20d    Manual STM withtrigger point releaseto bicep muscle group,  UT, pec minor, and latissimus G3 mob with IR and ER movement for several minutes Traction with IR and ER movement  Following manual full ER and IR without pain  Ther-Ex Sleeper stretch 2x 30sec with cuing for set up with good carry over AAROM IR x12 2-3sec; cuing to complete in pain free range Lat pulldown 35# 3 x10 with cuing to prevent shoulder hiking with good carry over Bent over rows OMEGA 20# 3x 10 with min cuing for proper Seated 90/90 scapular retraction 2# DB 3x 10 with min cuing for initial set with good carry over Standing IR RTB 3x 10 with min cuing for set up and eccentric control with good carry over Y on wall x10 with good carry over  of demo; with 3# DB 2x 10 with good carry over of previous          PT Education - 07/06/20 1008    Education Details therex form/technique    Person(s) Educated Patient    Methods Explanation;Demonstration;Verbal cues    Comprehension Verbalized understanding;Returned demonstration;Verbal cues required            PT Short Term Goals - 06/27/20 0931      PT SHORT TERM GOAL #1   Title Pt will be independent with HEP in order to improve strength and decrease pain in order to improve pain-free function at home and work.    Baseline 06/27/20 HEP given    Time 4    Period Weeks    Status New             PT Long Term Goals - 06/27/20 0932      PT LONG TERM GOAL #1    Title Patient will increase FOTO score to 79 to demonstrate predicted increase in functional mobility to complete ADLs    Baseline 06/27/20 52    Time 8    Period Weeks    Status New      PT LONG TERM GOAL #2   Title Pt will decrease worst pain as reported on NPRS by at least 3 points in order to demonstrate clinically significant reduction in pain.    Baseline 06/27/20 5/10 pain with catching    Time 8    Period Weeks    Status New      PT LONG TERM GOAL #3   Title Pt will demonstrate R shoulder active apley's IR to at least T7 in order to don/doff bra    Baseline 06/27/20 T12 with pain    Time 8    Period Weeks    Status New      PT LONG TERM GOAL #4   Title Pt will increase periscapular strength of  by at least 1/2 MMT grade in order to demonstrate improvement in strength and function    Baseline 06/27/20 R/L Y lower trap 3+/4-; T scapular retractors 3+/4-    Time 8    Period Weeks    Status New                 Plan - 07/06/20 1029    Clinical Impression Statement PT continued to utilize manual techniques to decrease pain with patient able to obtain full ER/IR PROM pain free following. PT cotinued therex progression for increased periscapular strength and neutral posture with good carry over. Patient is abel to comply with all multimodal cuing for proper technique of therex with good carry over. Patient motivated throughout session with no increased pain. 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;Occupation;Yard Work    [employer]chant [employer] officer Evolving/Moderate complexity    Clinical Decision Making Moderate    Rehab Potential Good    PT Frequency 2x / week    PT Duration 8 weeks    PT  Treatment/Interventions Electrical Stimulation;Traction;Ultrasound;Therapeutic activities;Neuromuscular re-education;Manual techniques;Joint Manipulations;Spinal Manipulations;Taping;Passive range of motion;Dry needling;Patient/family education;Balance training;Therapeutic exercise;Functional mobility training;Moist Heat;Cryotherapy;Iontophoresis 4mg /ml Dexamethasone;ADLs/Self Care Home Management    PT Next Visit Plan TDN/ manual techniques, postural restoration (periscapular activation)    PT Home Exercise Plan scap retractions, bicep stretch, sleeper stretch    Consulted and Agree with  Plan of Care Patient           Patient will benefit from skilled therapeutic intervention in order to improve the following deficits and impairments:  Decreased activity tolerance, Decreased endurance, Decreased range of motion, Decreased mobility, Decreased coordination, Increased muscle spasms, Impaired flexibility, Postural dysfunction, Impaired tone, Decreased strength, Increased fascial restricitons, Impaired UE functional use, Improper body mechanics, Pain  Visit Diagnosis: Chronic right shoulder pain  Stiffness of right shoulder, not elsewhere classified  Abnormal posture     Problem List Patient Active Problem List   Diagnosis Date Noted  . Grief 05/16/2018  . Visit for preventive health examination 02/10/2016  . SVT (supraventricular tachycardia) ([place]) 02/04/2015  . Overweight (BMI 25.0-29.9) 02/04/2015  . Premature menopause 02/04/2015  . Insomnia 02/04/2015   Durwin Reges DPT Durwin Reges 07/06/2020, 10:42 AM  [facility] [address] [place], [address] Phone: [phone]   Fax:  [phone]  Name: SHANYA FERRISS MRN: [id number] Date of Birth: [birth year]

## 2020-07-11 ENCOUNTER — Ambulatory Visit: Payer: 59 | Admitting: Physical Therapy

## 2020-07-11 ENCOUNTER — Encounter: Payer: Self-pay | Admitting: Physical Therapy

## 2020-07-11 ENCOUNTER — Other Ambulatory Visit: Payer: Self-pay

## 2020-07-11 DIAGNOSIS — M25611 Stiffness of right shoulder, not elsewhere classified: Secondary | ICD-10-CM

## 2020-07-11 DIAGNOSIS — M25511 Pain in right shoulder: Secondary | ICD-10-CM | POA: Diagnosis not present

## 2020-07-11 DIAGNOSIS — R293 Abnormal posture: Secondary | ICD-10-CM

## 2020-07-11 DIAGNOSIS — G8929 Other chronic pain: Secondary | ICD-10-CM | POA: Diagnosis not present

## 2020-07-11 NOTE — Therapy (Signed)
[facility] [address], [place], [address] Phone: [phone]   Fax:  [phone]  Physical Therapy Treatment  Patient Details  Name: Cynthia Johnson MRN: [id number] Date of Birth: 01-30-1966 No data recorded  Encounter Date: 07/11/2020   PT End of Session - 07/11/20 0909    Visit Number 5    Number of Visits 17    Date for PT Re-Evaluation 08/24/20    PT Start Time 0840    PT Stop Time 0918    PT Time Calculation (min) 38 min    Activity Tolerance Patient tolerated treatment well    Behavior During Therapy [facility] for tasks assessed/performed           Past Medical History:  Diagnosis Date  . Family history of breast cancer    updated genetic testing declined  . Sinus tachycardia 2012/ 2016   Fath  . Venous insufficiency 2011   SAnkar    Past Surgical History:  Procedure Laterality Date  . 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 TONSILLECTOMY Bilateral 01/13/2000    There were no vitals filed for this visit.   Subjective Assessment - 07/11/20 0900    Subjective Is doing better overall, reports no pain today. Reports her motion is improving. Compliance with HEP    Pertinent History Pt is a 54 year old female presenting with R upper arm pain since March. 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 Reports a catching sensation at tricep and bicep that comes on quickly and goes away quickly with reaching forward, and back behind her; reports no audible popping/clicking. Denies radiating pain, or sensation changes. Reports this pain is 5/10 when it comes on and no pain at rest. 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 Pt is an avid gardener and has difficulty weedeating for a long time.  Pt denies N/V, B&B changes, unexplained  weight fluctuation, saddle paresthesia, fever, night sweats, or unrelenting night pain at this time.    Limitations Lifting;House hold activities    How long can you sit comfortably? unlimited    How long can you stand comfortably? unlimited    How long can you walk comfortably? unlimited    Diagnostic tests March 2021    Pain Onset More than a month ago           Manual STM withtrigger point releaseto bicep muscle group,UT,pec minor, and latissimus G3 mob with IR and ER movement for several minutes Traction with IR and ER movement  Following manual full ER and IR without pain  Ther-Ex Sleeper stretch 10sec hold 6x with cuing for set up with good carry over following Prone Y 3x 6 with TC for scapular motion with good carry over following Prone T x10; with 1# DB x10; 2# DB x10 with cuing initially for scapular retraction with good carry over Lat pulldown 35# x10; 45# 2x 10 withmin cuing with increased weight to prevent shoulder hiking with eccentric control  OMEGA standing rows 25# 3x 10 with min cuing for eccentric control with good carry over Doorway pec/bicep stretch 60sec           PT Education - 07/11/20 0908    Education Details therex form/technique    Person(s) Educated Patient    Methods Explanation;Demonstration;Verbal cues  Comprehension Verbalized understanding;Returned demonstration;Verbal cues required            PT Short Term Goals - 06/27/20 0931      PT SHORT TERM GOAL #1   Title Pt will be independent with HEP in order to improve strength and decrease pain in order to improve pain-free function at home and work.    Baseline 06/27/20 HEP given    Time 4    Period Weeks    Status New             PT Long Term Goals - 06/27/20 0932      PT LONG TERM GOAL #1   Title Patient will increase FOTO score to 79 to demonstrate predicted increase in functional mobility to complete ADLs    Baseline 06/27/20 52    Time 8    Period Weeks     Status New      PT LONG TERM GOAL #2   Title Pt will decrease worst pain as reported on NPRS by at least 3 points in order to demonstrate clinically significant reduction in pain.    Baseline 06/27/20 5/10 pain with catching    Time 8    Period Weeks    Status New      PT LONG TERM GOAL #3   Title Pt will demonstrate R shoulder active apley's IR to at least T7 in order to don/doff bra    Baseline 06/27/20 T12 with pain    Time 8    Period Weeks    Status New      PT LONG TERM GOAL #4   Title Pt will increase periscapular strength of  by at least 1/2 MMT grade in order to demonstrate improvement in strength and function    Baseline 06/27/20 R/L Y lower trap 3+/4-; T scapular retractors 3+/4-    Time 8    Period Weeks    Status New                 Plan - 07/11/20 0912    Clinical Impression Statement PT continued to utilize maual techniques to decrease pain and improve motion with good success. PT continued therex progression for increased periscapular strength and motor control with good carry over. Patient is able to comply with all cuing for proper technique of therex with good motivation throughout session. PT will continue progression as able.    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;Occupation;Yard Work    [employer]chant [employer] officer Evolving/Moderate complexity    Clinical Decision Making Moderate    Rehab Potential Good    PT Frequency 2x / week    PT Duration 8 weeks    PT Treatment/Interventions Electrical Stimulation;Traction;Ultrasound;Therapeutic activities;Neuromuscular re-education;Manual techniques;Joint Manipulations;Spinal Manipulations;Taping;Passive range of motion;Dry needling;Patient/family education;Balance  training;Therapeutic exercise;Functional mobility training;Moist Heat;Cryotherapy;Iontophoresis 4mg /ml Dexamethasone;ADLs/Self Care Home Management    PT Next Visit Plan TDN/ manual techniques, postural restoration (periscapular activation)    PT Home Exercise Plan scap retractions, bicep stretch, sleeper stretch    Consulted and Agree with Plan of Care Patient           Patient will benefit from skilled therapeutic intervention in order to improve the following deficits and impairments:  Decreased activity tolerance, Decreased endurance, Decreased range of motion, Decreased mobility, Decreased coordination, Increased muscle spasms, Impaired flexibility, Postural dysfunction, Impaired tone, Decreased strength, Increased fascial restricitons, Impaired UE functional use,  Improper body mechanics, Pain  Visit Diagnosis: Chronic right shoulder pain  Stiffness of right shoulder, not elsewhere classified  Abnormal posture     Problem List Patient Active Problem List   Diagnosis Date Noted  . Grief 05/16/2018  . Visit for preventive health examination 02/10/2016  . SVT (supraventricular tachycardia) ([place]) 02/04/2015  . Overweight (BMI 25.0-29.9) 02/04/2015  . Premature menopause 02/04/2015  . Insomnia 02/04/2015   Durwin Reges DPT  Durwin Reges 07/11/2020, 9:16 AM  [facility] [address] [place], [address] Phone: [phone]   Fax:  [phone]  Name: Cynthia Johnson MRN: [id number] Date of Birth: [birth year]

## 2020-07-16 ENCOUNTER — Ambulatory Visit: Payer: 59 | Admitting: Physical Therapy

## 2020-07-16 ENCOUNTER — Encounter: Payer: Self-pay | Admitting: Physical Therapy

## 2020-07-16 ENCOUNTER — Other Ambulatory Visit: Payer: Self-pay

## 2020-07-16 DIAGNOSIS — G8929 Other chronic pain: Secondary | ICD-10-CM

## 2020-07-16 DIAGNOSIS — M25511 Pain in right shoulder: Secondary | ICD-10-CM | POA: Diagnosis not present

## 2020-07-16 DIAGNOSIS — R293 Abnormal posture: Secondary | ICD-10-CM

## 2020-07-16 DIAGNOSIS — M25611 Stiffness of right shoulder, not elsewhere classified: Secondary | ICD-10-CM | POA: Diagnosis not present

## 2020-07-16 NOTE — Therapy (Signed)
[facility] [address], [place], [address] Phone: [phone]   Fax:  [phone]  Physical Therapy Treatment  Patient Details  Name: Cynthia Johnson MRN: [id number] Date of Birth: April 13, 1966 No data recorded  Encounter Date: 07/16/2020   PT End of Session - 07/16/20 1449    Visit Number 6    Number of Visits 17    Date for PT Re-Evaluation 08/24/20    PT Start Time 0233    PT Stop Time 0300    PT Time Calculation (min) 27 min    Activity Tolerance Patient tolerated treatment well    Behavior During Therapy [facility] for tasks assessed/performed           Past Medical History:  Diagnosis Date  . Family history of breast cancer    updated genetic testing declined  . Sinus tachycardia 2012/ 2016   Fath  . Venous insufficiency 2011   SAnkar    Past Surgical History:  Procedure Laterality Date  . 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 TONSILLECTOMY Bilateral 01/13/2000    There were no vitals filed for this visit.   Subjective Assessment - 07/16/20 1435    Subjective Patient reports she is doing better overall. Motion is improving, but still difficult with behind back IR, and only 1x pain this weekend. Compliance with HEP.    Pertinent History Pt is a 54 year old female presenting with R upper arm pain since March. 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 Reports a catching sensation at tricep and bicep that comes on quickly and goes away quickly with reaching forward, and back behind her; reports no audible popping/clicking. Denies radiating pain, or sensation changes. Reports this pain is 5/10 when it comes on and no pain at rest. 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 Pt is an avid gardener and has difficulty weedeating for  a long time.  Pt denies N/V, B&B changes, unexplained weight fluctuation, saddle paresthesia, fever, night sweats, or unrelenting night pain at this time.    Limitations Lifting;House hold activities    How long can you sit comfortably? unlimited    How long can you stand comfortably? unlimited    How long can you walk comfortably? unlimited    Diagnostic tests March 2021    Pain Onset More than a month ago              Manual STM withtrigger point releaseto bicep muscle group,UT,pec minor, and latissimus G3 mob with IR and ER movement for several minutes Traction with IR and ER movement  Following manual full ER and IR without pain  Ther-Ex Band pull aparts BluTB 3x 10 with min cuing for posture and eccentric control with good carry over  Scaption BluTB 3x 8 with min cuing for set up with good carry over following Doorway pec/bicep stretch 60sec                         PT Education - 07/16/20 1448    Education Details therex form/technique    Person(s) Educated Patient    Methods Explanation;Demonstration;Verbal cues    Comprehension Verbalized understanding;Returned demonstration;Verbal cues required            PT Short Term Goals - 06/27/20  0931      PT SHORT TERM GOAL #1   Title Pt will be independent with HEP in order to improve strength and decrease pain in order to improve pain-free function at home and work.    Baseline 06/27/20 HEP given    Time 4    Period Weeks    Status New             PT Long Term Goals - 06/27/20 0932      PT LONG TERM GOAL #1   Title Patient will increase FOTO score to 79 to demonstrate predicted increase in functional mobility to complete ADLs    Baseline 06/27/20 52    Time 8    Period Weeks    Status New      PT LONG TERM GOAL #2   Title Pt will decrease worst pain as reported on NPRS by at least 3 points in order to demonstrate clinically significant reduction in pain.    Baseline 06/27/20 5/10  pain with catching    Time 8    Period Weeks    Status New      PT LONG TERM GOAL #3   Title Pt will demonstrate R shoulder active apley's IR to at least T7 in order to don/doff bra    Baseline 06/27/20 T12 with pain    Time 8    Period Weeks    Status New      PT LONG TERM GOAL #4   Title Pt will increase periscapular strength of  by at least 1/2 MMT grade in order to demonstrate improvement in strength and function    Baseline 06/27/20 R/L Y lower trap 3+/4-; T scapular retractors 3+/4-    Time 8    Period Weeks    Status New                  Patient will benefit from skilled therapeutic intervention in order to improve the following deficits and impairments:     Visit Diagnosis: Chronic right shoulder pain  Stiffness of right shoulder, not elsewhere classified  Abnormal posture     Problem List Patient Active Problem List   Diagnosis Date Noted  . Grief 05/16/2018  . Visit for preventive health examination 02/10/2016  . SVT (supraventricular tachycardia) ([place]) 02/04/2015  . Overweight (BMI 25.0-29.9) 02/04/2015  . Premature menopause 02/04/2015  . Insomnia 02/04/2015   Durwin Reges DPT Durwin Reges 07/16/2020, 2:57 PM  [facility] [address] [place], [address] Phone: [phone]   Fax:  [phone]  Name: Cynthia Johnson MRN: [id number] Date of Birth: May 04, 1966

## 2020-07-18 ENCOUNTER — Encounter: Payer: Self-pay | Admitting: Physical Therapy

## 2020-07-18 ENCOUNTER — Other Ambulatory Visit: Payer: Self-pay

## 2020-07-18 ENCOUNTER — Ambulatory Visit: Payer: 59 | Admitting: Physical Therapy

## 2020-07-18 DIAGNOSIS — M25511 Pain in right shoulder: Secondary | ICD-10-CM | POA: Diagnosis not present

## 2020-07-18 DIAGNOSIS — R293 Abnormal posture: Secondary | ICD-10-CM

## 2020-07-18 DIAGNOSIS — G8929 Other chronic pain: Secondary | ICD-10-CM | POA: Diagnosis not present

## 2020-07-18 DIAGNOSIS — M25611 Stiffness of right shoulder, not elsewhere classified: Secondary | ICD-10-CM

## 2020-07-18 NOTE — Therapy (Signed)
[facility] [address], [place], [address] Phone: [phone]   Fax:  [phone]  Physical Therapy Treatment/Discharge Summary  Reporting Period 06/28/20 - 07/18/20  Patient Details  Name: Cynthia Johnson MRN: [id number] Date of Birth: [birth year] No data recorded  Encounter Date: 07/18/2020   PT End of Session - 07/18/20 1023    Visit Number 7    Number of Visits 17    Date for PT Re-Evaluation 08/24/20    PT Start Time 0950    PT Stop Time 1020    PT Time Calculation (min) 30 min    Activity Tolerance Patient tolerated treatment well    Behavior During Therapy [facility] for tasks assessed/performed           Past Medical History:  Diagnosis Date  . Family history of breast cancer    updated genetic testing declined  . Sinus tachycardia 2012/ 2016   F[facility]  . Venous insufficiency 2011   SAnkar    Past Surgical History:  Procedure Laterality Date  . 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 TONSILLECTOMY Bilateral 01/13/2000    There were no vitals filed for this visit.   Subjective Assessment - 07/18/20 0952    Subjective Patient reports she feels good overall and feels like she can d/c PT today. Reports very minimal pain, very unfrequent, 0/10.    Pertinent History Pt is a 54 year old female presenting with R upper arm pain since March. 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 Reports a catching sensation at tricep and bicep that comes on quickly and goes away quickly with reaching forward, and back behind her; reports no audible popping/clicking. Denies radiating pain, or sensation changes. Reports this pain is 5/10 when it comes on and no pain at rest. 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 Pt is an avid gardener and has  difficulty weedeating for a long time.  Pt denies N/V, B&B changes, unexplained weight fluctuation, saddle paresthesia, fever, night sweats, or unrelenting night pain at this time.    Limitations Lifting;House hold activities    How long can you sit comfortably? unlimited    How long can you stand comfortably? unlimited    How long can you walk comfortably? unlimited    Diagnostic tests March 2021    Pain Onset More than a month ago           Ther-Ex PT reviewed the following HEP with patient with patient able to demonstrate a set of the following with min cuing for correction needed. PT educated patient on parameters of therex (how/when to inc/decrease intensity, frequency, rep/set range, stretch hold time, and purpose of therex) with verbalized understanding.  Access Code: [facility] Doorway Pec Stretch at 60 Degrees Abduction with Arm Straight - 3 x daily - 7 x weekly - 30sec hold Sleeper Stretch - 3 x daily - 7 x weekly - 30-60sec hold Standing Shoulder Internal Rotation Stretch with Towel - 3 x daily - 7 x weekly - 10 reps - 3sec hold Lat Pull Down - 1 x daily - 2 x weekly - 3 sets - 10 reps Standing Row with Anchored Resistance - 1 x daily - 2 x weekly - 3 sets - 10 reps Shoulder External Rotation and Scapular Retraction with Resistance -  1 x daily - 2 x weekly - 3 sets - 10 reps Standing Shoulder Scaption - 1 x daily - 2 x weekly - 3 sets - 10 reps Standing Eccentric Bicep curl - 1 x daily - 2 x weekly - 3 sets - 10 reps Seated Overhead Press with Dumbbells - 1 x daily - 2 x weekly - 3 sets - 10 reps                             PT Education - 07/18/20 1023    Education Details d/c HEP recommendations    Person(s) Educated Patient    Methods Explanation;Demonstration;Verbal cues;Handout    Comprehension Verbalized understanding;Returned demonstration;Verbal cues required            PT Short Term Goals - 07/18/20 0954      PT SHORT TERM GOAL #1    Title Pt will be independent with HEP in order to improve strength and decrease pain in order to improve pain-free function at home and work.    Baseline 06/27/20 HEP given; 07/18/20 completing regularly, HEP updated    Time 4    Period Weeks    Status Achieved             PT Long Term Goals - 07/18/20 0955      PT LONG TERM GOAL #1   Title Patient will increase FOTO score to 79 to demonstrate predicted increase in functional mobility to complete ADLs    Baseline 06/27/20 52; 07/18/20 100    Time 8    Period Weeks    Status Achieved      PT LONG TERM GOAL #2   Title Pt will decrease worst pain as reported on NPRS by at least 3 points in order to demonstrate clinically significant reduction in pain.    Baseline 06/27/20 5/10 pain with catching; 07/18/20 no pain over past wek    Time 8    Period Weeks    Status Achieved      PT LONG TERM GOAL #3   Title Pt will demonstrate R shoulder active apley's IR to at least T7 in order to don/doff bra    Baseline 06/27/20 T12 with pain;  07/18/20 T9    Time 8    Period Weeks    Status Achieved      PT LONG TERM GOAL #4   Title Pt will increase periscapular strength of  by at least 1/2 MMT grade in order to demonstrate improvement in strength and function    Baseline 06/27/20 R/L Y lower trap 3+/4-; T scapular retractors 3+/4- ; 07/18/20 R/L Y lower trap 4+/5; T scapular retractors 5/5    Time 8    Period Weeks    Status Achieved                 Plan - 07/18/20 1024    Clinical Impression Statement PT reassessed goals with patient meeting all goals, with remaining deficits in behind back IR and lifting objects from behind her. PT and pt agree that she is able to continue with gains made through PT in strength, ROM, and decreased pain with robust HEP. Pt verbalized and demonstrated understanding of all HEP recommendations. Patient given clinic contac info should any further questions/concerns arise.    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;Occupation;[employer]  Work    Stability/Clinical Decision Making Evolving/Moderate complexity    Clinical Decision Making Moderate    Rehab Potential Good    PT Frequency 2x / week    PT Duration 8 weeks    PT Treatment/Interventions Electrical Stimulation;Traction;Ultrasound;Therapeutic activities;Neuromuscular re-education;Manual techniques;Joint Manipulations;Spinal Manipulations;Taping;Passive range of motion;Dry needling;Patient/family education;Balance training;Therapeutic exercise;Functional mobility training;Moist Heat;Cryotherapy;Iontophoresis 4mg /ml Dexamethasone;ADLs/Self Care Home Management    PT Next Visit Plan TDN/ manual techniques, postural restoration (periscapular activation)    PT Home Exercise Plan scap retractions, bicep stretch, sleeper stretch    Consulted and Agree with Plan of Care Patient           Patient will benefit from skilled therapeutic intervention in order to improve the following deficits and impairments:  Decreased activity tolerance, Decreased endurance, Decreased range of motion, Decreased mobility, Decreased coordination, Increased muscle spasms, Impaired flexibility, Postural dysfunction, Impaired tone, Decreased strength, Increased fascial restricitons, Impaired UE functional use, Improper body mechanics, Pain  Visit Diagnosis: Chronic right shoulder pain  Stiffness of right shoulder, not elsewhere classified  Abnormal posture     Problem List Patient Active Problem List   Diagnosis Date Noted  . Grief 05/16/2018  . Visit for preventive health examination 02/10/2016  . SVT (supraventricular tachycardia) ([place]) 02/04/2015  . Overweight (BMI 25.0-29.9) 02/04/2015  . Premature menopause 02/04/2015  .  Insomnia 02/04/2015   Durwin Reges DPT Durwin Reges 07/18/2020, 10:27 AM  [facility] [address], [place], [address] Phone: [phone]   Fax:  [phone]  Name: BRYTTNEY NETZER MRN: [id number] Date of Birth: 1965-12-05

## 2020-07-20 ENCOUNTER — Ambulatory Visit: Payer: 59 | Admitting: Physical Therapy

## 2020-07-25 ENCOUNTER — Ambulatory Visit: Payer: 59 | Admitting: Physical Therapy

## 2020-07-27 ENCOUNTER — Ambulatory Visit: Payer: 59 | Admitting: Physical Therapy

## 2020-07-30 ENCOUNTER — Encounter: Payer: 59 | Admitting: Physical Therapy

## 2020-07-31 ENCOUNTER — Encounter: Payer: 59 | Admitting: Physical Therapy

## 2020-08-01 ENCOUNTER — Encounter: Payer: 59 | Admitting: Physical Therapy

## 2020-08-03 ENCOUNTER — Encounter: Payer: 59 | Admitting: Physical Therapy

## 2020-08-06 ENCOUNTER — Other Ambulatory Visit: Payer: Self-pay | Admitting: Internal Medicine

## 2020-08-06 ENCOUNTER — Encounter: Payer: 59 | Admitting: Physical Therapy

## 2020-08-06 DIAGNOSIS — Z1231 Encounter for screening mammogram for malignant neoplasm of breast: Secondary | ICD-10-CM

## 2020-08-08 ENCOUNTER — Encounter: Payer: 59 | Admitting: Physical Therapy

## 2020-08-10 ENCOUNTER — Encounter: Payer: 59 | Admitting: Physical Therapy

## 2020-08-13 ENCOUNTER — Encounter: Payer: 59 | Admitting: Physical Therapy

## 2020-08-15 ENCOUNTER — Encounter: Payer: 59 | Admitting: Physical Therapy

## 2020-08-16 ENCOUNTER — Encounter: Payer: 59 | Admitting: Physical Therapy

## 2020-08-17 ENCOUNTER — Encounter: Payer: 59 | Admitting: Physical Therapy

## 2020-08-20 ENCOUNTER — Encounter: Payer: 59 | Admitting: Physical Therapy

## 2020-08-22 ENCOUNTER — Encounter: Payer: 59 | Admitting: Physical Therapy

## 2020-08-23 ENCOUNTER — Encounter: Payer: 59 | Admitting: Physical Therapy

## 2020-08-24 ENCOUNTER — Encounter: Payer: 59 | Admitting: Physical Therapy

## 2020-08-27 ENCOUNTER — Encounter: Payer: 59 | Admitting: Physical Therapy

## 2020-08-29 ENCOUNTER — Encounter: Payer: 59 | Admitting: Physical Therapy

## 2020-08-30 ENCOUNTER — Encounter: Payer: 59 | Admitting: Physical Therapy

## 2020-08-31 ENCOUNTER — Encounter: Payer: 59 | Admitting: Physical Therapy

## 2020-09-26 ENCOUNTER — Other Ambulatory Visit: Payer: Self-pay

## 2020-09-26 ENCOUNTER — Ambulatory Visit
Admission: RE | Admit: 2020-09-26 | Discharge: 2020-09-26 | Disposition: A | Discharge status: Home / Self Care | Payer: 59 | Source: Ambulatory Visit | Attending: Internal Medicine | Admitting: Internal Medicine

## 2020-09-26 DIAGNOSIS — Z1231 Encounter for screening mammogram for malignant neoplasm of breast: Secondary | ICD-10-CM | POA: Diagnosis not present

## 2020-11-02 IMAGING — MG DIGITAL SCREENING BILATERAL MAMMOGRAM WITH TOMO AND CAD
8 series · 8 of 24 positions shown · non-contrast
Comparison: Previous exam(s).

CLINICAL DATA: Screening.

EXAM:
DIGITAL SCREENING BILATERAL MAMMOGRAM WITH TOMO AND CAD

[R MLO synth-2D]
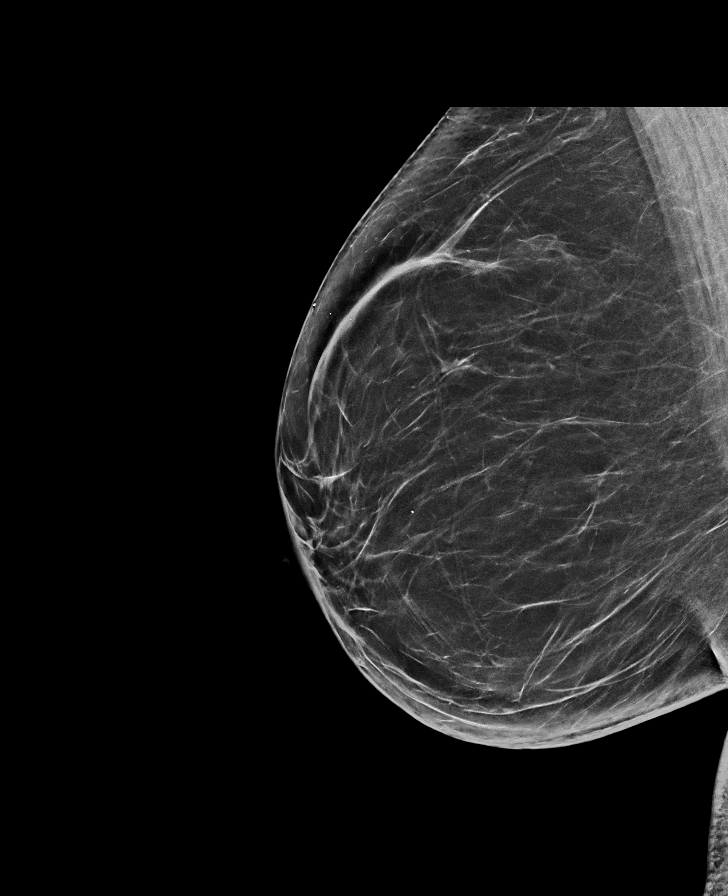

[L CC synth-2D]
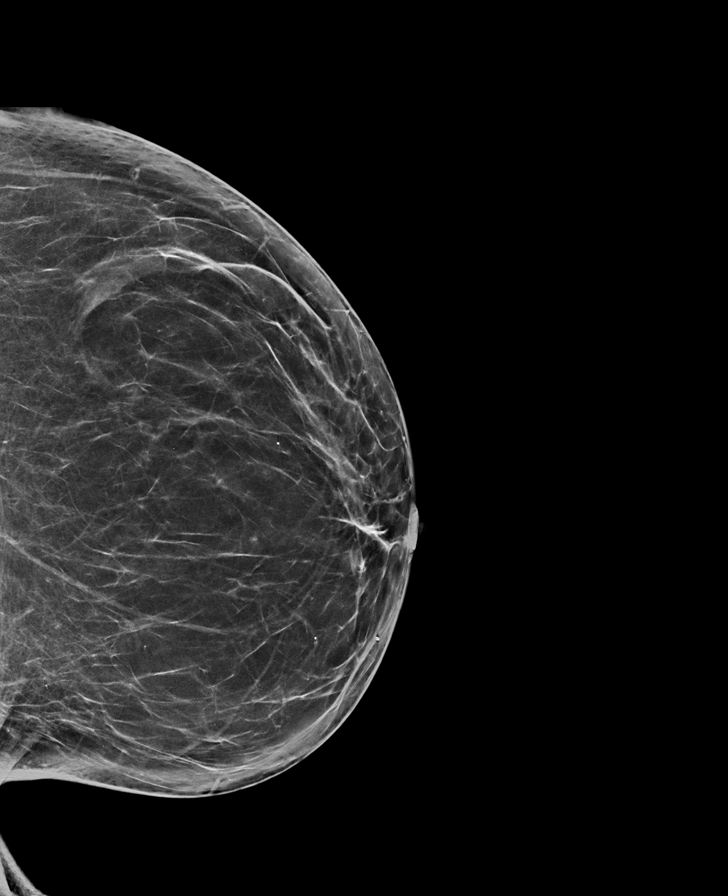

[R CC synth-2D]
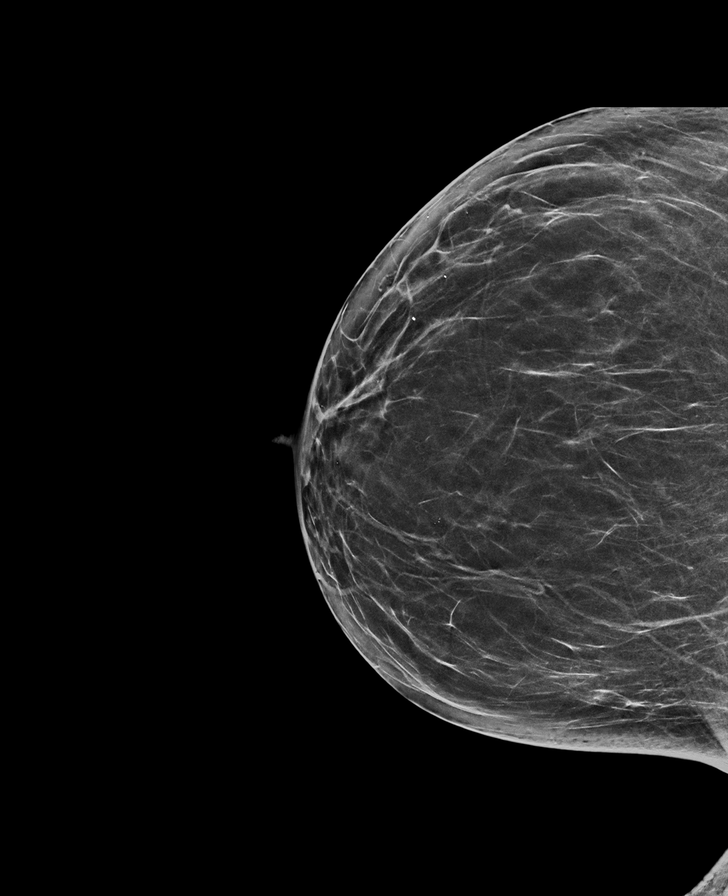

[L MLO synth-2D]
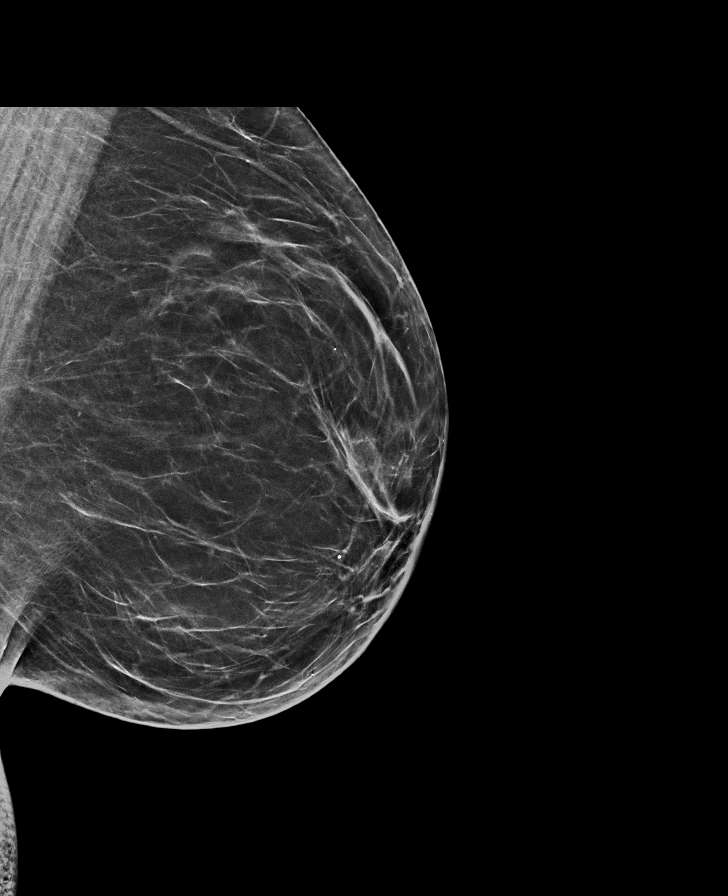

[R CC tomo · tomo slice 33/66.0]
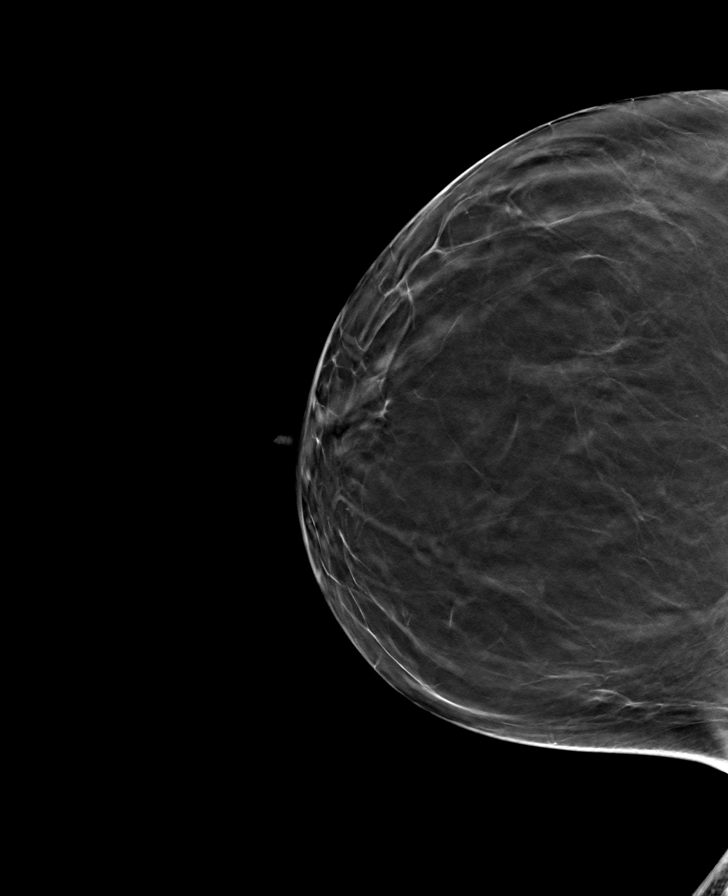

[R MLO tomo · tomo slice 36/71.0]
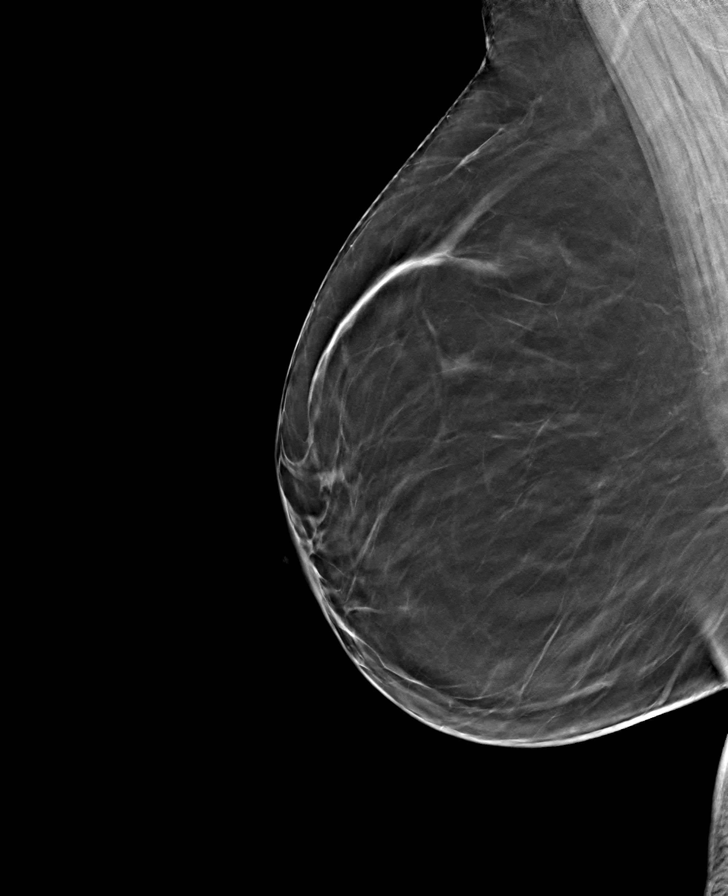

[L MLO tomo · tomo slice 35/70.0]
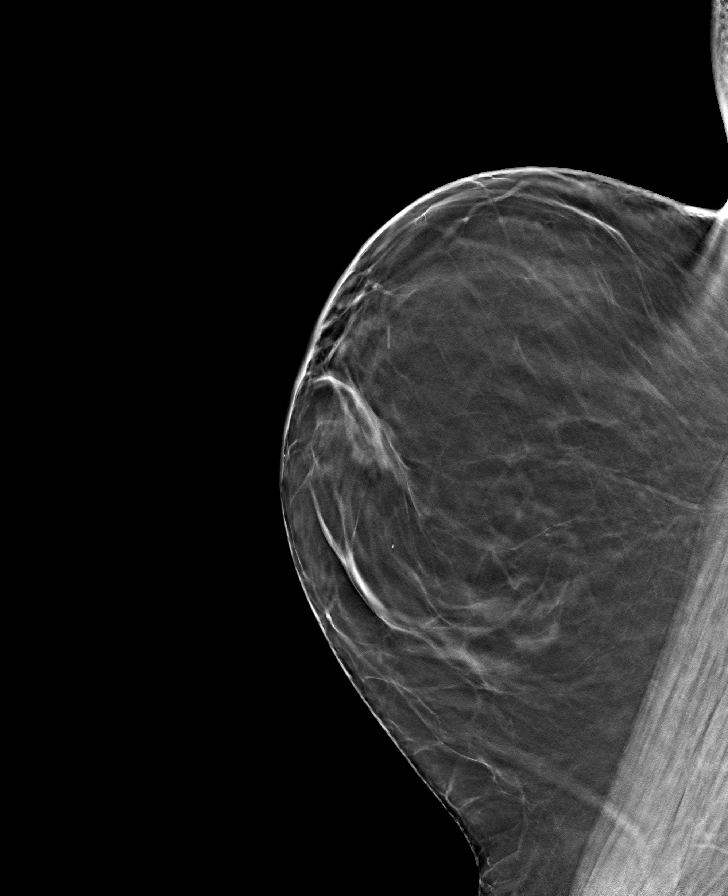

[L CC tomo · tomo slice 35/69.0]
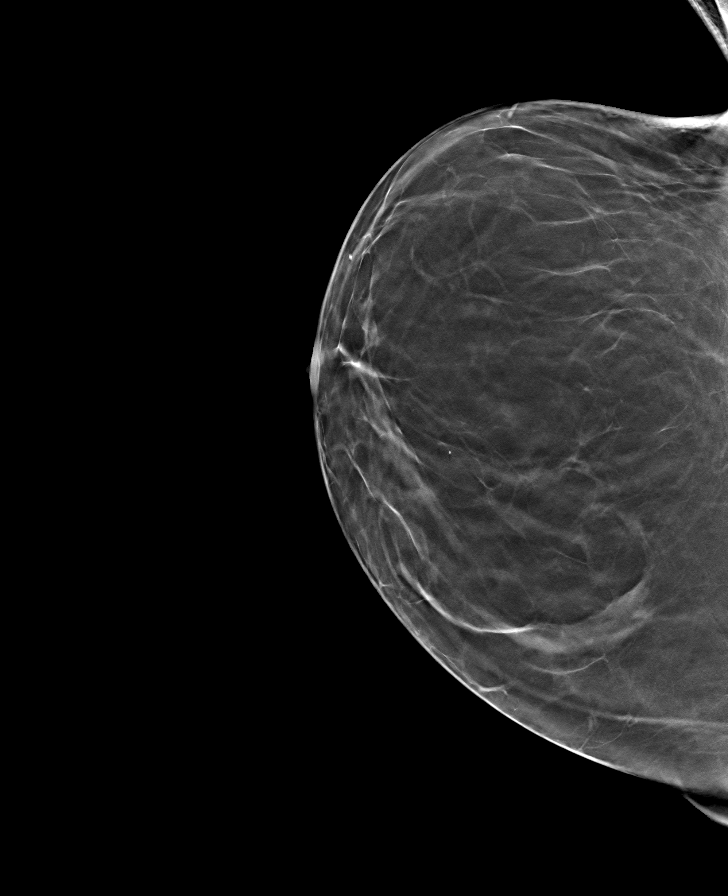

[8 of 24 positions shown; findings below may reference images not displayed]

ACR Breast Density Category b: There are scattered areas of
fibroglandular density.
FINDINGS: There are no findings suspicious for malignancy. Images were
processed with CAD.
IMPRESSION: No mammographic evidence of malignancy. A result letter of this
screening mammogram will be mailed directly to the patient.

RECOMMENDATION:
Screening mammogram in one year. (Code:[id number])

BI-RADS CATEGORY  1: Negative.

## 2020-11-14 DIAGNOSIS — M7501 Adhesive capsulitis of right shoulder: Secondary | ICD-10-CM | POA: Diagnosis not present

## 2020-11-27 DIAGNOSIS — R52 Pain, unspecified: Secondary | ICD-10-CM | POA: Diagnosis not present

## 2020-11-27 DIAGNOSIS — U071 COVID-19: Secondary | ICD-10-CM | POA: Diagnosis not present

## 2020-12-12 DIAGNOSIS — M25511 Pain in right shoulder: Secondary | ICD-10-CM | POA: Diagnosis not present

## 2020-12-20 ENCOUNTER — Other Ambulatory Visit (HOSPITAL_COMMUNITY): Payer: Self-pay | Admitting: Internal Medicine

## 2021-02-04 ENCOUNTER — Other Ambulatory Visit: Payer: Self-pay

## 2021-02-04 MED ORDER — AZITHROMYCIN 250 MG PO TABS
ORAL_TABLET | ORAL | 0 refills | Status: DC
  Filled 2021-02-04: qty 6, 5d supply, fill #0

## 2021-02-19 ENCOUNTER — Other Ambulatory Visit: Payer: Self-pay

## 2021-02-19 MED ORDER — SCOPOLAMINE 1 MG/3DAYS TD PT72
MEDICATED_PATCH | TRANSDERMAL | 1 refills | Status: DC
  Filled 2021-02-19: qty 4, 12d supply, fill #0
  Filled 2021-09-10: qty 4, 12d supply, fill #1

## 2021-03-01 ENCOUNTER — Other Ambulatory Visit: Payer: Self-pay

## 2021-03-01 MED FILL — Zoster Vac Recombinant Adjuvanted for IM Inj 50 MCG/0.5ML: INTRAMUSCULAR | 10 days supply | Qty: 1 | Fill #0 | Status: AC

## 2021-03-06 DIAGNOSIS — D23112 Other benign neoplasm of skin of right lower eyelid, including canthus: Secondary | ICD-10-CM | POA: Diagnosis not present

## 2021-03-06 DIAGNOSIS — I781 Nevus, non-neoplastic: Secondary | ICD-10-CM | POA: Diagnosis not present

## 2021-08-09 ENCOUNTER — Other Ambulatory Visit: Payer: Self-pay

## 2021-08-09 ENCOUNTER — Ambulatory Visit (INDEPENDENT_AMBULATORY_CARE_PROVIDER_SITE_OTHER): Payer: 59 | Admitting: Internal Medicine

## 2021-08-09 ENCOUNTER — Encounter: Payer: Self-pay | Admitting: Internal Medicine

## 2021-08-09 ENCOUNTER — Other Ambulatory Visit (HOSPITAL_COMMUNITY)
Admission: RE | Admit: 2021-08-09 | Discharge: 2021-08-09 | Disposition: A | Discharge status: Home / Self Care | Payer: 59 | Source: Ambulatory Visit | Attending: Internal Medicine | Admitting: Internal Medicine

## 2021-08-09 VITALS — BP 114/76 | HR 99 | Temp 96.2°F | Ht 65.25 in | Wt 173.8 lb

## 2021-08-09 DIAGNOSIS — F5102 Adjustment insomnia: Secondary | ICD-10-CM

## 2021-08-09 DIAGNOSIS — Z124 Encounter for screening for malignant neoplasm of cervix: Secondary | ICD-10-CM

## 2021-08-09 DIAGNOSIS — Z1231 Encounter for screening mammogram for malignant neoplasm of breast: Secondary | ICD-10-CM | POA: Diagnosis not present

## 2021-08-09 DIAGNOSIS — R5383 Other fatigue: Secondary | ICD-10-CM

## 2021-08-09 DIAGNOSIS — R7303 Prediabetes: Secondary | ICD-10-CM

## 2021-08-09 DIAGNOSIS — Z0000 Encounter for general adult medical examination without abnormal findings: Secondary | ICD-10-CM | POA: Diagnosis not present

## 2021-08-09 DIAGNOSIS — Z1211 Encounter for screening for malignant neoplasm of colon: Secondary | ICD-10-CM

## 2021-08-09 DIAGNOSIS — R635 Abnormal weight gain: Secondary | ICD-10-CM

## 2021-08-09 DIAGNOSIS — E663 Overweight: Secondary | ICD-10-CM | POA: Diagnosis not present

## 2021-08-09 LAB — CBC WITH DIFFERENTIAL/PLATELET
Basophils Absolute: 0 10*3/uL (ref 0.0–0.1)
Basophils Relative: 0.5 % (ref 0.0–3.0)
Eosinophils Absolute: 0.1 10*3/uL (ref 0.0–0.7)
Eosinophils Relative: 1.2 % (ref 0.0–5.0)
HCT: 41.1 % (ref 36.0–46.0)
Hemoglobin: 13.6 g/dL (ref 12.0–15.0)
Lymphocytes Relative: 27.5 % (ref 12.0–46.0)
Lymphs Abs: 1.3 10*3/uL (ref 0.7–4.0)
MCHC: 33.2 g/dL (ref 30.0–36.0)
MCV: 85.3 fl (ref 78.0–100.0)
Monocytes Absolute: 0.4 10*3/uL (ref 0.1–1.0)
Monocytes Relative: 7.9 % (ref 3.0–12.0)
Neutro Abs: 3 10*3/uL (ref 1.4–7.7)
Neutrophils Relative %: 62.9 % (ref 43.0–77.0)
Platelets: 252 10*3/uL (ref 150.0–400.0)
RBC: 4.81 Mil/uL (ref 3.87–5.11)
RDW: 14.3 % (ref 11.5–15.5)
WBC: 4.7 10*3/uL (ref 4.0–10.5)

## 2021-08-09 LAB — COMPREHENSIVE METABOLIC PANEL WITH GFR
ALT: 15 U/L (ref 0–35)
AST: 18 U/L (ref 0–37)
Albumin: 5 g/dL (ref 3.5–5.2)
Alkaline Phosphatase: 96 U/L (ref 39–117)
BUN: 16 mg/dL (ref 6–23)
CO2: 30 meq/L (ref 19–32)
Calcium: 10.5 mg/dL (ref 8.4–10.5)
Chloride: 102 meq/L (ref 96–112)
Creatinine, Ser: 0.86 mg/dL (ref 0.40–1.20)
GFR: 77.13 mL/min
Glucose, Bld: 98 mg/dL (ref 70–99)
Potassium: 4.7 meq/L (ref 3.5–5.1)
Sodium: 140 meq/L (ref 135–145)
Total Bilirubin: 0.7 mg/dL (ref 0.2–1.2)
Total Protein: 7.8 g/dL (ref 6.0–8.3)

## 2021-08-09 LAB — LIPID PANEL
Cholesterol: 182 mg/dL (ref 0–200)
HDL: 53.9 mg/dL
LDL Cholesterol: 106 mg/dL — ABNORMAL HIGH (ref 0–99)
NonHDL: 128.4
Total CHOL/HDL Ratio: 3
Triglycerides: 112 mg/dL (ref 0.0–149.0)
VLDL: 22.4 mg/dL (ref 0.0–40.0)

## 2021-08-09 LAB — TSH: TSH: 1.88 u[IU]/mL (ref 0.35–5.50)

## 2021-08-09 LAB — HEMOGLOBIN A1C: Hgb A1c MFr Bld: 5.7 % (ref 4.6–6.5)

## 2021-08-09 NOTE — Progress Notes (Signed)
Patient ID: Cynthia Johnson, female    DOB: September 18, 1966  Age: 55 y.o. MRN: [id number]  The patient is here for annual preventive examination  and management of other chronic and acute problems.  This visit occurred during the SARS-CoV-2 public health emergency.  Safety protocols were in place, including screening questions prior to the visit, additional usage of staff PPE, and extensive cleaning of exam room while observing appropriate contact time as indicated for disinfecting solutions.     The risk factors are reflected in the social history.  The roster of all physicians providing medical care to patient - is listed in the Snapshot section of the chart.  Activities of daily living:  The patient is 100% independent in all ADLs: dressing, toileting, feeding as well as independent mobility  Home safety : The patient has smoke detectors in the home. They wear seatbelts.  There are no firearms at home. There is no violence in the home.   There is no risks for hepatitis, STDs or HIV. There is no   history of blood transfusion. They have no travel history to infectious disease endemic areas of the world.  The patient has seen their dentist in the last six month. They have seen their eye doctor in the last year. They admit to slight hearing difficulty with regard to whispered voices and some television programs.  They have deferred audiologic testing in the last year.  They do not  have excessive sun exposure. Discussed the need for sun protection: hats, long sleeves and use of sunscreen if there is significant sun exposure.   Diet: the importance of a healthy diet is discussed. They do have a healthy diet.  The benefits of regular aerobic exercise were discussed. She walks 4 times per week ,  20 minutes.   Depression screen: there are no signs or vegative symptoms of depression- irritability, change in appetite, anhedonia, sadness/tearfullness.   The following portions of the patient's history  were reviewed and updated as appropriate: allergies, current medications, past family history, past medical history,  past surgical history, past social history  and problem list.  Visual acuity was not assessed per patient preference since she has regular follow up with her ophthalmologist. Hearing and body mass index were assessed and reviewed.   During the course of the visit the patient was educated and counseled about appropriate screening and preventive services including : fall prevention , diabetes screening, nutrition counseling, colorectal cancer screening, and recommended immunizations.    CC: The primary encounter diagnosis was Colon cancer screening. Diagnoses of Cervical cancer screening, Visit for preventive health examination, Abnormal weight gain, Prediabetes, Fatigue, unspecified type, Encounter for screening mammogram for malignant neoplasm of breast, Adjustment insomnia, and Overweight (BMI 25.0-29.9) were also pertinent to this visit.  History Klohe has a past medical history of Family history of breast cancer, Sinus tachycardia (2012/ 2016), and Venous insufficiency (2011).   She has a past surgical history that includes Finger surgery (Left, 1999) and Tonsillectomy (Bilateral, 01/13/2000).   Her family history includes Breast cancer (age of onset: 23) in her mother; Breast cancer (age of onset: 66) in her maternal aunt; Breast cancer (age of onset: 82) in her maternal grandmother; Cancer in her brother and father; Cancer (age of onset: 76) in her mother; Cancer (age of onset: 66) in her maternal grandmother; Cancer (age of onset: 27) in her maternal aunt; Heart disease in her father; Hyperlipidemia in her father; Hypertension in her brother and mother; Lung cancer (age  of onset: 65) in her maternal grandfather; Pemphigus vulgaris in her paternal grandfather; Seizures in her paternal grandfather; Thyroid disease in her mother.She reports that she has never smoked. She has never  used smokeless tobacco. She reports that she does not drink alcohol and does not use drugs.  Outpatient Medications Prior to Visit  Medication Sig Dispense Refill   Alpha-Lipoic Acid 100 MG CAPS Take 2 capsules by mouth 2 (two) times daily.     Calcium-Vitamin D (CALTRATE 600 PLUS-VIT D PO) Caltrate 600 plus D     Multiple Vitamin (MULTIVITAMIN) tablet Take 1 tablet by mouth daily.     niacinamide 100 MG tablet Take 100 mg by mouth 2 (two) times daily with a meal.     scopolamine (TRANSDERM-SCOP) 1 MG/3DAYS Place 1 patch (1.5 mg total) onto the skin every third day for 30 days 10 patch 1   Calcium Polycarbophil (FIBER-CAPS PO) Take 1 capsule by mouth 2 (two) times daily.     phentermine (ADIPEX-P) 37.5 MG tablet TAKE 1/2 TABLET BY MOUTH 2 TIMES A DAY BEFORE MEALS 30 tablet 0   scopolamine (TRANSDERM-SCOP) 1 MG/3DAYS Place 1 patch (1.5 mg total) onto the skin every 3 (three) days. 4 patch 1   Zoster Vaccine Adjuvanted [facility]) injection USE AS DIRECTED 1 each 1   azithromycin (ZITHROMAX) 250 MG tablet Two tablets to start, then one tablet daily until finished. 6 tablet 0   No facility-administered medications prior to visit.    Review of Systems  Patient denies headache, fevers, malaise, unintentional weight loss, skin rash, eye pain, sinus congestion and sinus pain, sore throat, dysphagia,  hemoptysis , cough, dyspnea, wheezing, chest pain, palpitations, orthopnea, edema, abdominal pain, nausea, melena, diarrhea, constipation, flank pain, dysuria, hematuria, urinary  Frequency, nocturia, numbness, tingling, seizures,  Focal weakness, Loss of consciousness,  Tremor, i, and suicidal ideation.     Objective:  BP 114/76 (BP Location: Left Arm, Patient Position: Sitting, Cuff Size: Normal)   Pulse 99   Temp (!) 96.2 F (35.7 C) (Temporal)   Ht 5' 5.25" (1.657 m)   Wt 173 lb 12.8 oz (78.8 kg)   SpO2 97%   BMI 28.70 kg/m   Physical Exam   General Appearance:    Alert, cooperative, no  distress, appears stated age  Head:    Normocephalic, without obvious abnormality, atraumatic  Eyes:    PERRL, conjunctiva/corneas clear, EOM's intact, fundi    benign, both eyes  Ears:    Normal TM's and external ear canals, both ears  Nose:   Nares normal, septum midline, mucosa normal, no drainage    or sinus tenderness  Throat:   Lips, mucosa, and tongue normal; teeth and gums normal  Neck:   Supple, symmetrical, trachea midline, no adenopathy;    thyroid:  no enlargement/tenderness/nodules; no carotid   bruit or JVD  Back:     Symmetric, no curvature, ROM normal, no CVA tenderness  Lungs:     Clear to auscultation bilaterally, respirations unlabored  Chest Wall:    No tenderness or deformity   Heart:    Regular rate and rhythm, S1 and S2 normal, no murmur, rub   or gallop  Breast Exam:    No tenderness, masses, or nipple abnormality  Abdomen:     Soft, non-tender, bowel sounds active all four quadrants,    no masses, no organomegaly  Genitalia:    Pelvic: cervix normal in appearance, external genitalia normal, no adnexal masses or tenderness, no cervical motion  tenderness, rectovaginal septum normal, uterus normal size, shape, and consistency and vagina normal without discharge  Extremities:   Extremities normal, atraumatic, no cyanosis or edema  Pulses:   2+ and symmetric all extremities  Skin:   Skin color, texture, turgor normal, no rashes or lesions  Lymph nodes:   Cervical, supraclavicular, and axillary nodes normal  Neurologic:   CNII-XII intact, normal strength, sensation and reflexes    throughout    Assessment & Plan:   Problem List Items Addressed This Visit       Unprioritized   Insomnia    Secondary to grief.  She lost  her father in 05-09-18 to [place],  Her mother  Died July 2020 [place]ARCH 2020 . She is currently sleeping without use of sedatives.      Overweight (BMI 25.0-29.9)    She has not taken  phentermine  Since last year.   Discussed alternative medications more appropriate for longterm use        Visit for preventive health examination    age appropriate education and counseling updated, referrals for preventative services and immunizations addressed, dietary and smoking counseling addressed, most recent labs reviewed.  I have personally reviewed and have noted:   1) the patient's medical and social history 2) The pt's use of alcohol, tobacco, and illicit drugs 3) The patient's current medications and supplements 4) Functional ability including ADL's, fall risk, home safety risk, hearing and visual impairment 5) Diet and physical activities 6) Evidence for depression or mood disorder 7) The patient's height, weight, and BMI have been recorded in the chart   I have made referrals, and provided counseling and education based on review of the above      Other Visit Diagnoses     Colon cancer screening    -  Primary   Relevant Orders   Cologuard   Cervical cancer screening       Relevant Orders   Cytology - PAP   Abnormal weight gain       Relevant Orders   TSH (Completed)   Prediabetes       Relevant Orders   Hemoglobin A1c (Completed)   Comprehensive metabolic panel (Completed)   Lipid panel (Completed)   Fatigue, unspecified type       Relevant Orders   CBC with Differential/Platelet (Completed)   Encounter for screening mammogram for malignant neoplasm of breast       Relevant Orders   MM 3D SCREEN BREAST BILATERAL      No orders of the defined types were placed in this encounter.   Medications Discontinued During This Encounter  Medication Reason   azithromycin (ZITHROMAX) 250 MG tablet    Zoster Vaccine Adjuvanted [facility]) injection Error   Calcium Polycarbophil (FIBER-CAPS PO) Error   phentermine (ADIPEX-P) 37.5 MG tablet    scopolamine (TRANSDERM-SCOP) 1 MG/3DAYS     Follow-up: No follow-ups on file.   Crecencio Mc, MD

## 2021-08-09 NOTE — Patient Instructions (Signed)
Your annual mammogram has been ordered.  You are encouraged (required) to call to make your appointment at [facility]   Cynthia Johnson may be a good option for you if you are still prediabetic and want help losing weight

## 2021-08-09 NOTE — Assessment & Plan Note (Signed)

## 2021-08-10 NOTE — Assessment & Plan Note (Addendum)
Secondary to grief.  She lost  her father in 05-02-2018 to [place],  Her mother  Died July 2020 [place] IN [place]ARCH 2020 . She is currently sleeping without use of sedatives.

## 2021-08-10 NOTE — Assessment & Plan Note (Signed)
She has not taken  phentermine  Since last year.  Discussed alternative medications more appropriate for longterm use

## 2021-08-13 LAB — CYTOLOGY - PAP: Diagnosis: NEGATIVE

## 2021-08-16 DIAGNOSIS — Z1211 Encounter for screening for malignant neoplasm of colon: Secondary | ICD-10-CM | POA: Diagnosis not present

## 2021-08-23 LAB — COLOGUARD: Cologuard: NEGATIVE

## 2021-09-11 ENCOUNTER — Other Ambulatory Visit: Payer: Self-pay

## 2021-09-30 ENCOUNTER — Ambulatory Visit
Admission: RE | Admit: 2021-09-30 | Discharge: 2021-09-30 | Disposition: A | Discharge status: Home / Self Care | Payer: 59 | Source: Ambulatory Visit | Attending: Internal Medicine | Admitting: Internal Medicine

## 2021-09-30 ENCOUNTER — Other Ambulatory Visit: Payer: Self-pay

## 2021-09-30 DIAGNOSIS — Z1231 Encounter for screening mammogram for malignant neoplasm of breast: Secondary | ICD-10-CM | POA: Diagnosis not present

## 2021-10-15 ENCOUNTER — Other Ambulatory Visit: Payer: Self-pay

## 2021-10-15 ENCOUNTER — Other Ambulatory Visit: Payer: Self-pay | Admitting: General Surgery

## 2021-10-15 DIAGNOSIS — R053 Chronic cough: Secondary | ICD-10-CM

## 2021-10-15 DIAGNOSIS — R051 Acute cough: Secondary | ICD-10-CM

## 2021-10-15 MED ORDER — BENZONATATE 100 MG PO CAPS
200.0000 mg | ORAL_CAPSULE | Freq: Three times a day (TID) | ORAL | 1 refills | Status: DC | PRN
  Filled 2021-10-15: qty 50, 9d supply, fill #0

## 2021-10-15 NOTE — Progress Notes (Signed)
Several day history of cough with clear sputum. No fever/ chills. No SOB/ CP.  No response to OTC cough medications. Lungs: Clear.  IMP: Viral bronchitis. Plan:Tessalon Pearls

## 2022-02-02 ENCOUNTER — Encounter: Payer: Self-pay | Admitting: Internal Medicine

## 2022-02-05 MED ORDER — MOUNJARO 2.5 MG/0.5ML ~~LOC~~ SOAJ
2.5000 mg | SUBCUTANEOUS | 2 refills | Status: DC
  Filled 2022-02-05: qty 2, 28d supply, fill #0

## 2022-02-05 NOTE — Telephone Encounter (Signed)
Cynthia Johnson has been sent to [facility]  ?

## 2022-02-06 ENCOUNTER — Other Ambulatory Visit: Payer: Self-pay

## 2022-02-07 ENCOUNTER — Other Ambulatory Visit: Payer: Self-pay

## 2022-02-12 ENCOUNTER — Other Ambulatory Visit: Payer: Self-pay | Admitting: Internal Medicine

## 2022-02-12 ENCOUNTER — Telehealth: Payer: Self-pay | Admitting: Internal Medicine

## 2022-02-12 ENCOUNTER — Other Ambulatory Visit: Payer: Self-pay

## 2022-02-12 MED ORDER — SEMAGLUTIDE-WEIGHT MANAGEMENT 0.25 MG/0.5ML ~~LOC~~ SOAJ
0.2500 mg | SUBCUTANEOUS | 2 refills | Status: AC
  Filled 2022-02-12 – 2022-02-28 (×3): qty 2, 28d supply, fill #0
  Filled 2022-03-21: qty 2, 28d supply, fill #1

## 2022-02-12 NOTE — Telephone Encounter (Signed)
Rx for wegovy sent to pharmacy per [place] memo re: noncoverage of mounjaro.  PA needed  ?

## 2022-02-14 NOTE — Telephone Encounter (Signed)
Spoke with pt to let her know that the mounjaro is not approved through insurance and that Dr. Derrel Nip has sent in a different medication. Pt gave a verbal understanding.  ?

## 2022-02-18 ENCOUNTER — Other Ambulatory Visit: Payer: Self-pay

## 2022-02-24 ENCOUNTER — Telehealth: Payer: Self-pay

## 2022-02-24 NOTE — Telephone Encounter (Signed)
PA has been submitted on covermymeds.  

## 2022-02-26 ENCOUNTER — Other Ambulatory Visit: Payer: Self-pay

## 2022-02-26 NOTE — Telephone Encounter (Signed)
Medication has been approved. Pt and pharmacy is aware.  ?

## 2022-02-27 ENCOUNTER — Other Ambulatory Visit: Payer: Self-pay

## 2022-02-28 ENCOUNTER — Other Ambulatory Visit: Payer: Self-pay

## 2022-03-19 LAB — TSH: TSH: 1.37 (ref 0.41–5.90)

## 2022-03-21 ENCOUNTER — Other Ambulatory Visit: Payer: Self-pay

## 2022-03-26 ENCOUNTER — Other Ambulatory Visit: Payer: Self-pay

## 2022-03-28 ENCOUNTER — Encounter: Payer: Self-pay | Admitting: Internal Medicine

## 2022-03-28 ENCOUNTER — Other Ambulatory Visit: Payer: Self-pay

## 2022-04-02 ENCOUNTER — Other Ambulatory Visit: Payer: Self-pay

## 2022-04-02 MED ORDER — SAXENDA 18 MG/3ML ~~LOC~~ SOPN
0.6000 mg | PEN_INJECTOR | Freq: Every day | SUBCUTANEOUS | 0 refills | Status: DC
Start: 2022-04-02 — End: 2022-08-13
  Filled 2022-04-02: qty 15, 30d supply, fill #0

## 2022-04-02 NOTE — Telephone Encounter (Signed)
Looks like patients will have to use daily injections of Saxenda uring the  [employer] shortage.  This one has been sent but will need PA done

## 2022-04-04 NOTE — Telephone Encounter (Signed)
PA for Saxenda has been submitted on covermymeds.  

## 2022-04-09 ENCOUNTER — Other Ambulatory Visit: Payer: Self-pay

## 2022-07-07 ENCOUNTER — Other Ambulatory Visit: Payer: Self-pay

## 2022-07-08 ENCOUNTER — Other Ambulatory Visit: Payer: Self-pay

## 2022-07-08 MED ORDER — SCOPOLAMINE 1 MG/3DAYS TD PT72
MEDICATED_PATCH | TRANSDERMAL | 1 refills | Status: DC
  Filled 2022-07-08: qty 10, 30d supply, fill #0

## 2022-07-09 ENCOUNTER — Other Ambulatory Visit: Payer: Self-pay

## 2022-08-04 ENCOUNTER — Other Ambulatory Visit: Payer: Self-pay | Admitting: Internal Medicine

## 2022-08-04 DIAGNOSIS — Z1231 Encounter for screening mammogram for malignant neoplasm of breast: Secondary | ICD-10-CM

## 2022-08-13 ENCOUNTER — Encounter: Payer: Self-pay | Admitting: Internal Medicine

## 2022-08-13 ENCOUNTER — Other Ambulatory Visit: Payer: Self-pay

## 2022-08-13 ENCOUNTER — Ambulatory Visit (INDEPENDENT_AMBULATORY_CARE_PROVIDER_SITE_OTHER): Payer: Commercial Managed Care - PPO | Admitting: Internal Medicine

## 2022-08-13 VITALS — BP 104/72 | HR 79 | Temp 98.1°F | Ht 65.25 in | Wt 184.6 lb

## 2022-08-13 DIAGNOSIS — Z0000 Encounter for general adult medical examination without abnormal findings: Secondary | ICD-10-CM

## 2022-08-13 DIAGNOSIS — R7303 Prediabetes: Secondary | ICD-10-CM | POA: Diagnosis not present

## 2022-08-13 DIAGNOSIS — E785 Hyperlipidemia, unspecified: Secondary | ICD-10-CM

## 2022-08-13 DIAGNOSIS — R5383 Other fatigue: Secondary | ICD-10-CM | POA: Diagnosis not present

## 2022-08-13 LAB — CBC WITH DIFFERENTIAL/PLATELET
Basophils Absolute: 0 10*3/uL (ref 0.0–0.1)
Basophils Relative: 0.5 % (ref 0.0–3.0)
Eosinophils Absolute: 0 10*3/uL (ref 0.0–0.7)
Eosinophils Relative: 0.8 % (ref 0.0–5.0)
HCT: 39.8 % (ref 36.0–46.0)
Hemoglobin: 12.8 g/dL (ref 12.0–15.0)
Lymphocytes Relative: 27.3 % (ref 12.0–46.0)
Lymphs Abs: 1 10*3/uL (ref 0.7–4.0)
MCHC: 32.2 g/dL (ref 30.0–36.0)
MCV: 84 fl (ref 78.0–100.0)
Monocytes Absolute: 0.3 10*3/uL (ref 0.1–1.0)
Monocytes Relative: 8.6 % (ref 3.0–12.0)
Neutro Abs: 2.3 10*3/uL (ref 1.4–7.7)
Neutrophils Relative %: 62.8 % (ref 43.0–77.0)
Platelets: 245 10*3/uL (ref 150.0–400.0)
RBC: 4.73 Mil/uL (ref 3.87–5.11)
RDW: 15.1 % (ref 11.5–15.5)
WBC: 3.7 10*3/uL — ABNORMAL LOW (ref 4.0–10.5)

## 2022-08-13 LAB — COMPREHENSIVE METABOLIC PANEL
ALT: 15 U/L (ref 0–35)
AST: 16 U/L (ref 0–37)
Albumin: 4.8 g/dL (ref 3.5–5.2)
Alkaline Phosphatase: 102 U/L (ref 39–117)
BUN: 18 mg/dL (ref 6–23)
CO2: 28 mEq/L (ref 19–32)
Calcium: 10.2 mg/dL (ref 8.4–10.5)
Chloride: 104 mEq/L (ref 96–112)
Creatinine, Ser: 0.89 mg/dL (ref 0.40–1.20)
GFR: 73.5 mL/min (ref >60.00)
Glucose, Bld: 99 mg/dL (ref 70–99)
Potassium: 5 mEq/L (ref 3.5–5.1)
Sodium: 139 mEq/L (ref 135–145)
Total Bilirubin: 0.5 mg/dL (ref 0.2–1.2)
Total Protein: 7.5 g/dL (ref 6.0–8.3)

## 2022-08-13 LAB — TSH: TSH: 1.3 u[IU]/mL (ref 0.35–5.50)

## 2022-08-13 LAB — HEMOGLOBIN A1C: Hgb A1c MFr Bld: 5.8 % (ref 4.6–6.5)

## 2022-08-13 LAB — LIPID PANEL
Cholesterol: 175 mg/dL (ref 0–200)
HDL: 46.9 mg/dL (ref >39.00)
LDL Cholesterol: 104 mg/dL — ABNORMAL HIGH (ref 0–99)
NonHDL: 127.63
Total CHOL/HDL Ratio: 4
Triglycerides: 117 mg/dL (ref 0.0–149.0)
VLDL: 23.4 mg/dL (ref 0.0–40.0)

## 2022-08-13 LAB — LDL CHOLESTEROL, DIRECT: Direct LDL: 122 mg/dL

## 2022-08-13 MED ORDER — OMEPRAZOLE 20 MG PO CPDR
20.0000 mg | DELAYED_RELEASE_CAPSULE | Freq: Every day | ORAL | 1 refills | Status: DC
  Filled 2022-08-13: qty 90, 90d supply, fill #0
  Filled 2022-11-06: qty 90, 90d supply, fill #1

## 2022-08-13 MED ORDER — PHENTERMINE HCL 37.5 MG PO TABS
ORAL_TABLET | ORAL | 0 refills | Status: DC
  Filled 2022-08-13: qty 30, 30d supply, fill #0

## 2022-08-13 NOTE — Assessment & Plan Note (Signed)

## 2022-08-13 NOTE — Progress Notes (Signed)
The patient is here for annual preventive examination .   The risk factors are reflected in the social history.   The roster of all physicians providing medical care to patient - is listed in the Snapshot section of the chart.   Activities of daily living:  The patient is 100% independent in all ADLs: dressing, toileting, feeding as well as independent mobility   Home safety : The patient has smoke detectors in the home. They wear seatbelts.  There are no unsecured firearms at home. There is no violence in the home.    There is no risks for hepatitis, STDs or HIV. There is no   history of blood transfusion. They have no travel history to infectious disease endemic areas of the world.   The patient has seen their dentist in the last six month. They have seen their eye doctor in the last year. The patinet  denies slight hearing difficulty with regard to whispered voices and some television programs.  They have deferred audiologic testing in the last year.  They do not  have excessive sun exposure. Discussed the need for sun protection: hats, long sleeves and use of sunscreen if there is significant sun exposure.    Diet: the importance of a healthy diet is discussed. They do have a healthy diet.   The benefits of regular aerobic exercise were discussed. The patient  exercises  3 to 5 days per week  for  60 minutes.    Depression screen: there are no signs or vegative symptoms of depression- irritability, change in appetite, anhedonia, sadness/tearfullness.   The following portions of the patient's history were reviewed and updated as appropriate: allergies, current medications, past family history, past medical history,  past surgical history, past social history  and problem list.   Visual acuity was not assessed per patient preference since the patient has regular follow up with an  ophthalmologist. Hearing and body mass index were assessed and reviewed.    During the course of the visit the  patient was educated and counseled about appropriate screening and preventive services including : fall prevention , diabetes screening, nutrition counseling, colorectal cancer screening, and recommended immunizations.    Chief Complaint:  None.      Review of Symptoms  Patient denies headache, fevers, malaise, unintentional weight loss, skin rash, eye pain, sinus congestion and sinus pain, sore throat, dysphagia,  hemoptysis , cough, dyspnea, wheezing, chest pain, palpitations, orthopnea, edema, abdominal pain, nausea, melena, diarrhea, constipation, flank pain, dysuria, hematuria, urinary  Frequency, nocturia, numbness, tingling, seizures,  Focal weakness, Loss of consciousness,  Tremor, insomnia, depression, anxiety, and suicidal ideation.    Physical Exam:  BP 104/72 (BP Location: Left Arm, Patient Position: Sitting, Cuff Size: Normal)   Pulse 79   Temp 98.1 F (36.7 C) (Oral)   Ht 5' 5.25" (1.657 m)   Wt 184 lb 9.6 oz (83.7 kg)   SpO2 99%   BMI 30.48 kg/m    General appearance: alert, cooperative and appears stated age Head: Normocephalic, without obvious abnormality, atraumatic Eyes: conjunctivae/corneas clear. PERRL, EOM's intact. Fundi benign. Ears: normal TM's and external ear canals both ears Nose: Nares normal. Septum midline. Mucosa normal. No drainage or sinus tenderness. Throat: lips, mucosa, and tongue normal; teeth and gums normal Neck: no adenopathy, no carotid bruit, no JVD, supple, symmetrical, trachea midline and thyroid not enlarged, symmetric, no tenderness/mass/nodules Lungs: clear to auscultation bilaterally Breasts: normal appearance, no masses or tenderness Heart: regular rate and rhythm, S1, S2  normal, no murmur, click, rub or gallop Abdomen: soft, non-tender; bowel sounds normal; no masses,  no organomegaly Extremities: extremities normal, atraumatic, no cyanosis or edema Pulses: 2+ and symmetric Skin: Skin color, texture, turgor normal. No rashes or  lesions Neurologic: Alert and oriented X 3, normal strength and tone. Normal symmetric reflexes. Normal coordination and gait.     Assessment and Plan:  Visit for preventive health examination age appropriate education and counseling updated, referrals for preventative services and immunizations addressed, dietary and smoking counseling addressed, most recent labs reviewed.  I have personally reviewed and have noted:   1) the patient's medical and social history 2) The pt's use of alcohol, tobacco, and illicit drugs 3) The patient's current medications and supplements 4) Functional ability including ADL's, fall risk, home safety risk, hearing and visual impairment 5) Diet and physical activities 6) Evidence for depression or mood disorder 7) The patient's height, weight, and BMI have been recorded in the chart   I have made referrals, and provided counseling and education based on review of the above   Updated Medication List Outpatient Encounter Medications as of 08/13/2022  Medication Sig   Alpha-Lipoic Acid 100 MG CAPS Take 2 capsules by mouth 2 (two) times daily.   Calcium-Vitamin D (CALTRATE 600 PLUS-VIT D PO) Caltrate 600 plus D   loratadine (CLARITIN) 10 MG tablet Take 10 mg by mouth daily.   Magnesium 250 MG TABS Take 1 tablet by mouth daily.   Multiple Vitamin (MULTIVITAMIN) tablet Take 1 tablet by mouth daily.   niacinamide 100 MG tablet Take 100 mg by mouth 2 (two) times daily with a meal.   omeprazole (PRILOSEC) 20 MG capsule Take 1 capsule (20 mg total) by mouth daily.   scopolamine (TRANSDERM-SCOP) 1 MG/3DAYS Place 1 patch (1.5 mg total) onto the skin every third day for 30 days   scopolamine (TRANSDERM-SCOP) 1 MG/3DAYS Place 1 patch (1 mg total) onto the skin every third day for 60 days   [DISCONTINUED] omeprazole (PRILOSEC) 20 MG capsule Take 20 mg by mouth daily.   phentermine (ADIPEX-P) 37.5 MG tablet TAKE 1/2 TABLET BY MOUTH 2 TIMES A DAY BEFORE MEALS    [DISCONTINUED] benzonatate (TESSALON PERLES) 100 MG capsule Take 2 capsules (200 mg total) by mouth 3 (three) times daily as needed for cough. (Patient not taking: Reported on 08/13/2022)   [DISCONTINUED] Liraglutide -Weight Management (SAXENDA) 18 MG/3ML SOPN Inject 0.6 mg under the skin daily. Increase dose weekly as follows: Week 2: 1.2 mg daily ; Week 3: 1.8 mg daily; Week 4: 2.4 mg daily (Patient not taking: Reported on 08/13/2022)   No facility-administered encounter medications on file as of 08/13/2022.

## 2022-08-13 NOTE — Patient Instructions (Signed)
Phentermine refilled    START EXERCISING ASAP    CONSIDER NOOM OR GOLO

## 2022-10-01 ENCOUNTER — Ambulatory Visit
Admission: RE | Admit: 2022-10-01 | Discharge: 2022-10-01 | Disposition: A | Discharge status: Home / Self Care | Payer: 59 | Source: Ambulatory Visit | Attending: Internal Medicine | Admitting: Internal Medicine

## 2022-10-01 DIAGNOSIS — Z1231 Encounter for screening mammogram for malignant neoplasm of breast: Secondary | ICD-10-CM | POA: Insufficient documentation

## 2022-10-14 ENCOUNTER — Other Ambulatory Visit: Payer: Self-pay

## 2022-10-14 MED ORDER — AZITHROMYCIN 250 MG PO TABS
ORAL_TABLET | ORAL | 0 refills | Status: DC
  Filled 2022-10-14: qty 6, 5d supply, fill #0

## 2022-11-06 ENCOUNTER — Other Ambulatory Visit: Payer: Self-pay

## 2022-11-06 ENCOUNTER — Other Ambulatory Visit: Payer: Self-pay | Admitting: Internal Medicine

## 2022-11-06 MED ORDER — PHENTERMINE HCL 37.5 MG PO TABS
ORAL_TABLET | ORAL | 0 refills | Status: DC
  Filled 2022-11-06: qty 30, 30d supply, fill #0

## 2022-11-06 NOTE — Telephone Encounter (Signed)
Refilled: 08/13/2022 Last OV: 08/13/2022 Next OV: 08/19/2023

## 2023-03-25 DIAGNOSIS — L821 Other seborrheic keratosis: Secondary | ICD-10-CM | POA: Diagnosis not present

## 2023-03-25 DIAGNOSIS — D225 Melanocytic nevi of trunk: Secondary | ICD-10-CM | POA: Diagnosis not present

## 2023-03-25 DIAGNOSIS — D2272 Melanocytic nevi of left lower limb, including hip: Secondary | ICD-10-CM | POA: Diagnosis not present

## 2023-03-25 DIAGNOSIS — D2262 Melanocytic nevi of left upper limb, including shoulder: Secondary | ICD-10-CM | POA: Diagnosis not present

## 2023-03-25 DIAGNOSIS — L814 Other melanin hyperpigmentation: Secondary | ICD-10-CM | POA: Diagnosis not present

## 2023-03-25 DIAGNOSIS — D2271 Melanocytic nevi of right lower limb, including hip: Secondary | ICD-10-CM | POA: Diagnosis not present

## 2023-03-25 DIAGNOSIS — D2261 Melanocytic nevi of right upper limb, including shoulder: Secondary | ICD-10-CM | POA: Diagnosis not present

## 2023-06-24 ENCOUNTER — Other Ambulatory Visit: Payer: Self-pay | Admitting: Internal Medicine

## 2023-06-24 ENCOUNTER — Other Ambulatory Visit: Payer: Self-pay

## 2023-06-24 MED ORDER — OMEPRAZOLE 20 MG PO CPDR
20.0000 mg | DELAYED_RELEASE_CAPSULE | Freq: Every day | ORAL | 1 refills | Status: DC
  Filled 2023-06-24: qty 90, 90d supply, fill #0
  Filled 2023-10-20: qty 90, 90d supply, fill #1

## 2023-08-18 ENCOUNTER — Other Ambulatory Visit: Payer: Self-pay | Admitting: Internal Medicine

## 2023-08-18 DIAGNOSIS — Z1231 Encounter for screening mammogram for malignant neoplasm of breast: Secondary | ICD-10-CM

## 2023-08-19 ENCOUNTER — Ambulatory Visit (INDEPENDENT_AMBULATORY_CARE_PROVIDER_SITE_OTHER): Payer: 59 | Admitting: Internal Medicine

## 2023-08-19 ENCOUNTER — Other Ambulatory Visit: Payer: Self-pay

## 2023-08-19 ENCOUNTER — Encounter: Payer: Self-pay | Admitting: Internal Medicine

## 2023-08-19 VITALS — BP 116/72 | HR 77 | Ht 65.25 in | Wt 180.6 lb

## 2023-08-19 DIAGNOSIS — M7711 Lateral epicondylitis, right elbow: Secondary | ICD-10-CM

## 2023-08-19 DIAGNOSIS — Z0000 Encounter for general adult medical examination without abnormal findings: Secondary | ICD-10-CM

## 2023-08-19 DIAGNOSIS — E785 Hyperlipidemia, unspecified: Secondary | ICD-10-CM | POA: Diagnosis not present

## 2023-08-19 DIAGNOSIS — Z9189 Other specified personal risk factors, not elsewhere classified: Secondary | ICD-10-CM

## 2023-08-19 DIAGNOSIS — M7710 Lateral epicondylitis, unspecified elbow: Secondary | ICD-10-CM | POA: Insufficient documentation

## 2023-08-19 DIAGNOSIS — E663 Overweight: Secondary | ICD-10-CM

## 2023-08-19 DIAGNOSIS — R7303 Prediabetes: Secondary | ICD-10-CM

## 2023-08-19 DIAGNOSIS — R5383 Other fatigue: Secondary | ICD-10-CM | POA: Diagnosis not present

## 2023-08-19 DIAGNOSIS — E875 Hyperkalemia: Secondary | ICD-10-CM

## 2023-08-19 LAB — COMPREHENSIVE METABOLIC PANEL
ALT: 16 U/L (ref 0–35)
AST: 16 U/L (ref 0–37)
Albumin: 4.7 g/dL (ref 3.5–5.2)
Alkaline Phosphatase: 98 U/L (ref 39–117)
BUN: 21 mg/dL (ref 6–23)
CO2: 28 meq/L (ref 19–32)
Calcium: 10.2 mg/dL (ref 8.4–10.5)
Chloride: 105 meq/L (ref 96–112)
Creatinine, Ser: 0.94 mg/dL (ref 0.40–1.20)
GFR: 68.35 mL/min (ref >60.00)
Glucose, Bld: 104 mg/dL — ABNORMAL HIGH (ref 70–99)
Potassium: 5.4 meq/L — ABNORMAL HIGH (ref 3.5–5.1)
Sodium: 139 meq/L (ref 135–145)
Total Bilirubin: 0.5 mg/dL (ref 0.2–1.2)
Total Protein: 7.3 g/dL (ref 6.0–8.3)

## 2023-08-19 LAB — CBC WITH DIFFERENTIAL/PLATELET
Basophils Absolute: 0 10*3/uL (ref 0.0–0.1)
Basophils Relative: 0.5 % (ref 0.0–3.0)
Eosinophils Absolute: 0.1 10*3/uL (ref 0.0–0.7)
Eosinophils Relative: 1.9 % (ref 0.0–5.0)
HCT: 42.3 % (ref 36.0–46.0)
Hemoglobin: 13.5 g/dL (ref 12.0–15.0)
Lymphocytes Relative: 24.8 % (ref 12.0–46.0)
Lymphs Abs: 1.3 10*3/uL (ref 0.7–4.0)
MCHC: 31.9 g/dL (ref 30.0–36.0)
MCV: 89.4 fL (ref 78.0–100.0)
Monocytes Absolute: 0.4 10*3/uL (ref 0.1–1.0)
Monocytes Relative: 8.4 % (ref 3.0–12.0)
Neutro Abs: 3.4 10*3/uL (ref 1.4–7.7)
Neutrophils Relative %: 64.4 % (ref 43.0–77.0)
Platelets: 302 10*3/uL (ref 150.0–400.0)
RBC: 4.73 Mil/uL (ref 3.87–5.11)
RDW: 14 % (ref 11.5–15.5)
WBC: 5.3 10*3/uL (ref 4.0–10.5)

## 2023-08-19 LAB — LIPID PANEL
Cholesterol: 179 mg/dL (ref 0–200)
HDL: 47.1 mg/dL (ref >39.00)
LDL Cholesterol: 104 mg/dL — ABNORMAL HIGH (ref 0–99)
NonHDL: 131.82
Total CHOL/HDL Ratio: 4
Triglycerides: 138 mg/dL (ref 0.0–149.0)
VLDL: 27.6 mg/dL (ref 0.0–40.0)

## 2023-08-19 LAB — HEMOGLOBIN A1C: Hgb A1c MFr Bld: 5.7 % (ref 4.6–6.5)

## 2023-08-19 LAB — TSH: TSH: 1.65 u[IU]/mL (ref 0.35–5.50)

## 2023-08-19 LAB — MICROALBUMIN / CREATININE URINE RATIO
Creatinine,U: 142.7 mg/dL
Microalb Creat Ratio: 0.5 mg/g (ref 0.0–30.0)
Microalb, Ur: 0.8 mg/dL (ref 0.0–1.9)

## 2023-08-19 LAB — LDL CHOLESTEROL, DIRECT: Direct LDL: 122 mg/dL

## 2023-08-19 MED ORDER — PHENTERMINE HCL 37.5 MG PO TABS
18.7500 mg | ORAL_TABLET | Freq: Two times a day (BID) | ORAL | 0 refills | Status: DC
  Filled 2023-08-19: qty 30, 30d supply, fill #0

## 2023-08-19 MED ORDER — MELOXICAM 15 MG PO TABS
15.0000 mg | ORAL_TABLET | Freq: Every day | ORAL | 2 refills | Status: AC
  Filled 2023-08-19: qty 30, 30d supply, fill #0
  Filled 2023-09-16: qty 30, 30d supply, fill #1
  Filled 2023-10-20: qty 30, 30d supply, fill #2

## 2023-08-19 NOTE — Assessment & Plan Note (Addendum)
Right arm Secondary overuse. She has been using ibuprofen , Recommend trial of meloxicam and tylenol.

## 2023-08-19 NOTE — Progress Notes (Signed)
Patient ID: Cynthia Johnson, female    DOB: 06-May-1966  Age: 57 y.o. MRN: [id number]  The patient is here for annual preventive examination and management of other chronic and acute problems.   The risk factors are reflected in the social history.   The roster of all physicians providing medical care to patient - is listed in the Snapshot section of the chart.   Activities of daily living:  The patient is 100% independent in all ADLs: dressing, toileting, feeding as well as independent mobility   Home safety : The patient has smoke detectors in the home. They wear seatbelts.  There are no unsecured firearms at home. There is no violence in the home.    There is no risks for hepatitis, STDs or HIV. There is no   history of blood transfusion. They have no travel history to infectious disease endemic areas of the world.   The patient has seen their dentist in the last six month. They have seen their eye doctor in the last year. The patinet  denies slight hearing difficulty with regard to whispered voices and some television programs.  They have deferred audiologic testing in the last year.  They do not  have excessive sun exposure. Discussed the need for sun protection: hats, long sleeves and use of sunscreen if there is significant sun exposure.    Diet: the importance of a healthy diet is discussed. They do have a healthy diet.   The benefits of regular aerobic exercise were discussed. The patient  exercises  3 to 5 days per week  for  60 minutes.    Depression screen: there are no signs or vegative symptoms of depression- irritability, change in appetite, anhedonia, sadness/tearfullness.   The following portions of the patient's history were reviewed and updated as appropriate: allergies, current medications, past family history, past medical history,  past surgical history, past social history  and problem list.   Visual acuity was not assessed per patient preference since the patient has  regular follow up with an  ophthalmologist. Hearing and body mass index were assessed and reviewed.    During the course of the visit the patient was educated and counseled about appropriate screening and preventive services including : fall prevention , diabetes screening, nutrition counseling, colorectal cancer screening, and recommended immunizations.    Chief Complaint:  1) overweight: reviewed successful weight loss trials:  her weight in Oct 2023 was 185.  Personal best 166 in 2019.  Using phentermine on weekends during work shifts . Last refill in January,  Current weight 180     Review of Symptoms  Patient denies headache, fevers, malaise, unintentional weight loss, skin rash, eye pain, sinus congestion and sinus pain, sore throat, dysphagia,  hemoptysis , cough, dyspnea, wheezing, chest pain, palpitations, orthopnea, edema, abdominal pain, nausea, melena, diarrhea, constipation, flank pain, dysuria, hematuria, urinary  Frequency, nocturia, numbness, tingling, seizures,  Focal weakness, Loss of consciousness,  Tremor, insomnia, depression, anxiety, and suicidal ideation.    Physical Exam:  BP 116/72   Pulse 77   Ht 5' 5.25" (1.657 m)   Wt 180 lb 9.6 oz (81.9 kg)   SpO2 97%   BMI 29.82 kg/m    Physical Exam Vitals reviewed.  Constitutional:      General: She is not in acute distress.    Appearance: Normal appearance. She is well-developed and normal weight. She is not ill-appearing, toxic-appearing or diaphoretic.  HENT:     Head: Normocephalic.  Right Ear: Tympanic membrane, ear canal and external ear normal. There is no impacted cerumen.     Left Ear: Tympanic membrane, ear canal and external ear normal. There is no impacted cerumen.     Nose: Nose normal.     Mouth/Throat:     Mouth: Mucous membranes are moist.     Pharynx: Oropharynx is clear.  Eyes:     General: No scleral icterus.       Right eye: No discharge.        Left eye: No discharge.      Conjunctiva/sclera: Conjunctivae normal.     Pupils: Pupils are equal, round, and reactive to light.  Neck:     Thyroid: No thyromegaly.     Vascular: No carotid bruit or JVD.  Cardiovascular:     Rate and Rhythm: Normal rate and regular rhythm.     Heart sounds: Normal heart sounds.  Pulmonary:     Effort: Pulmonary effort is normal. No respiratory distress.     Breath sounds: Normal breath sounds.  Chest:  Breasts:    Breasts are symmetrical.     Right: Normal. No swelling, inverted nipple, mass, nipple discharge, skin change or tenderness.     Left: Normal. No swelling, inverted nipple, mass, nipple discharge, skin change or tenderness.  Abdominal:     General: Bowel sounds are normal.     Palpations: Abdomen is soft. There is no mass.     Tenderness: There is no abdominal tenderness. There is no guarding or rebound.  Musculoskeletal:        General: Normal range of motion.     Cervical back: Normal range of motion and neck supple.  Lymphadenopathy:     Cervical: No cervical adenopathy.     Upper Body:     Right upper body: No supraclavicular, axillary or pectoral adenopathy.     Left upper body: No supraclavicular, axillary or pectoral adenopathy.  Skin:    General: Skin is warm and dry.  Neurological:     General: No focal deficit present.     Mental Status: She is alert and oriented to person, place, and time. Mental status is at baseline.  Psychiatric:        Mood and Affect: Mood normal.        Behavior: Behavior normal.        Thought Content: Thought content normal.        Judgment: Judgment normal.    Assessment and Plan: Visit for preventive health examination Assessment & Plan: age appropriate education and counseling updated, referrals for preventative services and immunizations addressed, dietary and smoking counseling addressed, most recent labs reviewed.  I have personally reviewed and have noted:   1) the patient's medical and social history 2) The pt's  use of alcohol, tobacco, and illicit drugs 3) The patient's current medications and supplements 4) Functional ability including ADL's, fall risk, home safety risk, hearing and visual impairment 5) Diet and physical activities 6) Evidence for depression or mood disorder 7) The patient's height, weight, and BMI have been recorded in the chart  I have made referrals, and provided counseling and education based on review of the above    Prediabetes -     Hemoglobin A1c -     Comprehensive metabolic panel -     Microalbumin / creatinine urine ratio  Fatigue, unspecified type -     TSH -     CBC with Differential/Platelet  Hyperlipidemia, unspecified hyperlipidemia type -  Lipid panel -     LDL cholesterol, direct  Overweight (BMI 25.0-29.9) Assessment & Plan: She uses phentermine  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  I have also recommended that patient  increase the intensity of her exercise from walking to bicycling for 30 minutes a minimum of 5 days per week. Screening for lipid disorders, thyroid and diabetes to be done today.     Lateral epicondylitis of right elbow Assessment & Plan: Right arm Secondary overuse. She has been using ibuprofen , Recommend trial of meloxicam and tylenol.    Other orders -     Meloxicam; Take 1 tablet (15 mg total) by mouth daily.  Dispense: 30 tablet; Refill: 2 -     Phentermine HCl; Take 0.5 tablets (18.75 mg total) by mouth 2 (two) times daily before meals.  Dispense: 30 tablet; Refill: 0    No follow-ups on file.  Sherlene Shams, MD

## 2023-08-19 NOTE — Patient Instructions (Signed)
Meloxicam  once daily for lateral epicondylitis,  DON'T COMBINE WITH IBUPROFEN.     You can add up to 2000 mg of acetominophen (tylenol) every day safely  In divided doses (500 mg every 6 hours  Or 1000 mg every 12 hours.)   Phentermine has been refilled

## 2023-08-19 NOTE — Assessment & Plan Note (Addendum)
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  I have also recommended that patient  increase the intensity of her exercise from walking to bicycling for 30 minutes a minimum of 5 days per week. Screening for lipid disorders, thyroid and diabetes to be done today.

## 2023-08-19 NOTE — Assessment & Plan Note (Signed)

## 2023-08-20 NOTE — Addendum Note (Signed)
Addended by: Sherlene Shams on: 08/20/2023 10:06 PM   Modules accepted: Orders

## 2023-08-24 ENCOUNTER — Other Ambulatory Visit (INDEPENDENT_AMBULATORY_CARE_PROVIDER_SITE_OTHER): Payer: 59

## 2023-08-24 DIAGNOSIS — E875 Hyperkalemia: Secondary | ICD-10-CM

## 2023-08-25 LAB — BASIC METABOLIC PANEL
BUN: 20 mg/dL (ref 6–23)
CO2: 24 meq/L (ref 19–32)
Calcium: 9.6 mg/dL (ref 8.4–10.5)
Chloride: 105 meq/L (ref 96–112)
Creatinine, Ser: 0.95 mg/dL (ref 0.40–1.20)
GFR: 67.48 mL/min (ref >60.00)
Glucose, Bld: 92 mg/dL (ref 70–99)
Potassium: 4.2 meq/L (ref 3.5–5.1)
Sodium: 139 meq/L (ref 135–145)

## 2023-09-16 ENCOUNTER — Other Ambulatory Visit: Payer: Self-pay

## 2023-10-07 ENCOUNTER — Ambulatory Visit
Admission: RE | Admit: 2023-10-07 | Discharge: 2023-10-07 | Disposition: A | Discharge status: Home / Self Care | Payer: 59 | Source: Ambulatory Visit | Attending: Internal Medicine | Admitting: Internal Medicine

## 2023-10-07 DIAGNOSIS — Z1231 Encounter for screening mammogram for malignant neoplasm of breast: Secondary | ICD-10-CM | POA: Diagnosis not present

## 2024-03-25 DIAGNOSIS — D2271 Melanocytic nevi of right lower limb, including hip: Secondary | ICD-10-CM | POA: Diagnosis not present

## 2024-03-25 DIAGNOSIS — D225 Melanocytic nevi of trunk: Secondary | ICD-10-CM | POA: Diagnosis not present

## 2024-03-25 DIAGNOSIS — D2272 Melanocytic nevi of left lower limb, including hip: Secondary | ICD-10-CM | POA: Diagnosis not present

## 2024-03-25 DIAGNOSIS — L821 Other seborrheic keratosis: Secondary | ICD-10-CM | POA: Diagnosis not present

## 2024-03-25 DIAGNOSIS — D2261 Melanocytic nevi of right upper limb, including shoulder: Secondary | ICD-10-CM | POA: Diagnosis not present

## 2024-03-25 DIAGNOSIS — L80 Vitiligo: Secondary | ICD-10-CM | POA: Diagnosis not present

## 2024-03-25 DIAGNOSIS — D2262 Melanocytic nevi of left upper limb, including shoulder: Secondary | ICD-10-CM | POA: Diagnosis not present

## 2024-06-08 ENCOUNTER — Encounter: Payer: Self-pay | Admitting: Emergency Medicine

## 2024-06-08 ENCOUNTER — Other Ambulatory Visit: Payer: Self-pay

## 2024-06-08 ENCOUNTER — Emergency Department

## 2024-06-08 ENCOUNTER — Emergency Department
Admission: EM | Admit: 2024-06-08 | Discharge: 2024-06-08 | Disposition: A | Discharge status: Home / Self Care | Attending: Emergency Medicine | Admitting: Emergency Medicine

## 2024-06-08 DIAGNOSIS — I10 Essential (primary) hypertension: Secondary | ICD-10-CM | POA: Diagnosis not present

## 2024-06-08 DIAGNOSIS — Z79899 Other long term (current) drug therapy: Secondary | ICD-10-CM | POA: Diagnosis not present

## 2024-06-08 DIAGNOSIS — R1011 Right upper quadrant pain: Secondary | ICD-10-CM

## 2024-06-08 DIAGNOSIS — K810 Acute cholecystitis: Secondary | ICD-10-CM | POA: Diagnosis present

## 2024-06-08 DIAGNOSIS — K828 Other specified diseases of gallbladder: Principal | ICD-10-CM

## 2024-06-08 DIAGNOSIS — K811 Chronic cholecystitis: Secondary | ICD-10-CM | POA: Diagnosis not present

## 2024-06-08 DIAGNOSIS — R1013 Epigastric pain: Secondary | ICD-10-CM | POA: Diagnosis not present

## 2024-06-08 DIAGNOSIS — K8050 Calculus of bile duct without cholangitis or cholecystitis without obstruction: Secondary | ICD-10-CM | POA: Diagnosis not present

## 2024-06-08 DIAGNOSIS — R932 Abnormal findings on diagnostic imaging of liver and biliary tract: Secondary | ICD-10-CM | POA: Diagnosis not present

## 2024-06-08 LAB — COMPREHENSIVE METABOLIC PANEL WITH GFR
ALT: 21 U/L (ref 0–44)
AST: 29 U/L (ref 15–41)
Albumin: 4.6 g/dL (ref 3.5–5.0)
Alkaline Phosphatase: 96 U/L (ref 38–126)
Anion gap: 12 (ref 5–15)
BUN: 17 mg/dL (ref 6–20)
CO2: 20 mmol/L — ABNORMAL LOW (ref 22–32)
Calcium: 9.4 mg/dL (ref 8.9–10.3)
Chloride: 106 mmol/L (ref 98–111)
Creatinine, Ser: 0.94 mg/dL (ref 0.44–1.00)
GFR, Estimated: >60 mL/min (ref >60)
Glucose, Bld: 131 mg/dL — ABNORMAL HIGH (ref 70–99)
Potassium: 3.5 mmol/L (ref 3.5–5.1)
Sodium: 138 mmol/L (ref 135–145)
Total Bilirubin: 0.6 mg/dL (ref 0.0–1.2)
Total Protein: 7.9 g/dL (ref 6.5–8.1)

## 2024-06-08 LAB — LIPASE, BLOOD: Lipase: 30 U/L (ref 11–51)

## 2024-06-08 LAB — CBC
HCT: 41.1 % (ref 36.0–46.0)
Hemoglobin: 13.3 g/dL (ref 12.0–15.0)
MCH: 28.5 pg (ref 26.0–34.0)
MCHC: 32.4 g/dL (ref 30.0–36.0)
MCV: 88 fL (ref 80.0–100.0)
Platelets: 274 K/uL (ref 150–400)
RBC: 4.67 MIL/uL (ref 3.87–5.11)
RDW: 12.9 % (ref 11.5–15.5)
WBC: 7.6 K/uL (ref 4.0–10.5)
nRBC: 0 % (ref 0.0–0.2)

## 2024-06-08 LAB — TROPONIN I (HIGH SENSITIVITY): Troponin I (High Sensitivity): 2 ng/L (ref <18)

## 2024-06-08 MED ORDER — MORPHINE SULFATE (PF) 2 MG/ML IV SOLN: 2.0000 mg | INTRAVENOUS | Status: DC | PRN

## 2024-06-08 MED ORDER — OXYCODONE HCL 5 MG PO TABS: 5.0000 mg | ORAL_TABLET | ORAL | Status: DC | PRN

## 2024-06-08 MED ORDER — LACTATED RINGERS IV SOLN: INTRAVENOUS | Status: DC

## 2024-06-08 MED ORDER — TRAMADOL HCL 50 MG PO TABS
50.0000 mg | ORAL_TABLET | Freq: Four times a day (QID) | ORAL | 0 refills | Status: AC | PRN
  Filled 2024-06-08: qty 12, 3d supply, fill #0

## 2024-06-08 MED ORDER — ONDANSETRON 4 MG PO TBDP
4.0000 mg | ORAL_TABLET | Freq: Three times a day (TID) | ORAL | 0 refills | Status: DC | PRN
  Filled 2024-06-08: qty 20, 7d supply, fill #0

## 2024-06-08 MED ORDER — PIPERACILLIN-TAZOBACTAM 3.375 G IVPB: 3.3750 g | Freq: Three times a day (TID) | INTRAVENOUS | Status: DC

## 2024-06-08 MED ORDER — KETOROLAC TROMETHAMINE 30 MG/ML IJ SOLN
15.0000 mg | Freq: Once | INTRAMUSCULAR | Status: AC
  Administered 2024-06-08: 15 mg via INTRAVENOUS
  Filled 2024-06-08: qty 1

## 2024-06-08 MED ORDER — ACETAMINOPHEN 500 MG PO TABS: 1000.0000 mg | ORAL_TABLET | Freq: Four times a day (QID) | ORAL | Status: DC

## 2024-06-08 MED ORDER — ONDANSETRON HCL 4 MG/2ML IJ SOLN: 4.0000 mg | Freq: Four times a day (QID) | INTRAMUSCULAR | Status: DC | PRN

## 2024-06-08 MED ORDER — AMOXICILLIN-POT CLAVULANATE 875-125 MG PO TABS
1.0000 | ORAL_TABLET | Freq: Two times a day (BID) | ORAL | 0 refills | Status: AC
  Filled 2024-06-08: qty 14, 7d supply, fill #0

## 2024-06-08 MED ORDER — ONDANSETRON 4 MG PO TBDP: 4.0000 mg | ORAL_TABLET | Freq: Four times a day (QID) | ORAL | Status: DC | PRN

## 2024-06-08 NOTE — ED Provider Notes (Signed)
 [facility] Provider Note    Event Date/Time   First MD Initiated Contact with Patient 06/08/24 1820     (approximate)   History   Abdominal Pain   HPI  Cynthia Johnson is a 58 year old female presenting to the emergency department for evaluation of abdominal pain.  Around 4 PM, today, patient had onset of epigastric and right upper quadrant pain with associated nausea without vomiting.  No history of similar.  No history of abdominal surgeries.  Normal bowel movements.     Physical Exam   Triage Vital Signs: ED Triage Vitals  Encounter Vitals Group     BP 06/08/24 1802 (!) 182/92     Girls Systolic BP Percentile --      Girls Diastolic BP Percentile --      Boys Systolic BP Percentile --      Boys Diastolic BP Percentile --      Pulse Rate 06/08/24 1802 73     Resp 06/08/24 1802 18     Temp 06/08/24 1802 (!) 97.4 F (36.3 C)     Temp Source 06/08/24 1802 Oral     SpO2 06/08/24 1802 100 %     Weight 06/08/24 1800 178 lb (80.7 kg)     Height 06/08/24 1800 5' 6 (1.676 m)     Head Circumference --      Peak Flow --      Pain Score 06/08/24 1800 7     Pain Loc --      Pain Education --      Exclude from Growth Chart --     Most recent vital signs: Vitals:   06/08/24 1802 06/08/24 2115  BP: (!) 182/92 125/83  Pulse: 73   Resp: 18   Temp: (!) 97.4 F (36.3 C)   SpO2: 100%      General: Awake, interactive  CV:  Regular rate, good peripheral perfusion.  Resp:  Unlabored respirations.  Abd:  Nondistended, soft, focal tenderness to palpation in the right upper quadrant with positive Murphy sign, mild tenderness in the epigastric area, remainder of abdomen nontender Neuro:  Symmetric facial movement, fluid speech   ED Results / Procedures / Treatments   Labs (all labs ordered are listed, but only abnormal results are displayed) Labs Reviewed  COMPREHENSIVE METABOLIC PANEL WITH GFR - Abnormal; Notable for the following components:       Result Value   CO2 20 (*)    Glucose, Bld 131 (*)    All other components within normal limits  LIPASE, BLOOD  CBC  TROPONIN I (HIGH SENSITIVITY)     EKG EKG independently reviewed and interpreted by myself demonstrates:  EKG demonstrates normal sinus rhythm rate of 68, PR 140, cures 74, QTc 395, no acute ST changes  RADIOLOGY Imaging independently reviewed and interpreted by myself demonstrates:  Ultrasound demonstrates gallbladder wall thickening without evidence of gallstones concerning for acalculous cholecystitis  Formal Radiology Read:  US  ABDOMEN LIMITED RUQ (LIVER/GB) Result Date: 06/08/2024 CLINICAL DATA:  [id number] RUQ abdominal pain [id number] EXAM: ULTRASOUND ABDOMEN LIMITED RIGHT UPPER QUADRANT COMPARISON:  None Available. FINDINGS: Gallbladder: Gallbladder wall thickening with hypervascularity and associated pericholecystic fluid. No calcified gallstone within the gallbladder lumen. No sonographic Murphy sign noted by sonographer. Common bile duct: Diameter: 2 mm Liver: No focal lesion identified. Within normal limits in parenchymal echogenicity. Portal vein is patent on color Doppler imaging with normal direction of blood flow towards the liver. Other: None. IMPRESSION: Findings suggestive of  acalculous acute cholecystitis. Electronically Signed   By: Morgane  Naveau M.D.   On: 06/08/2024 19:23    PROCEDURES:  Critical Care performed: No  Procedures   MEDICATIONS ORDERED IN ED: Medications  piperacillin -tazobactam (ZOSYN ) IVPB 3.375 g (has no administration in time range)  acetaminophen  (TYLENOL ) tablet 1,000 mg (has no administration in time range)  oxyCODONE  (Oxy IR/ROXICODONE ) immediate release tablet 5-10 mg (has no administration in time range)  morphine  (PF) 2 MG/ML injection 2 mg (has no administration in time range)  ondansetron  (ZOFRAN -ODT) disintegrating tablet 4 mg (has no administration in time range)    Or  ondansetron  (ZOFRAN ) injection 4 mg (has no  administration in time range)  lactated ringers  infusion (has no administration in time range)  ketorolac  (TORADOL ) 30 MG/ML injection 15 mg (15 mg Intravenous Given 06/08/24 1957)     IMPRESSION / MDM / ASSESSMENT AND PLAN / ED COURSE  I reviewed the triage vital signs and the nursing notes.  Differential diagnosis includes, but is not limited to, cholecystitis, choledocholithiasis, biliary colic, ACS, arrhythmia, lower suspicion other acute intra-abdominal process  Patient's presentation is most consistent with acute presentation with potential threat to life or bodily function.  58 year old female presenting to the ER for evaluation of right upper quadrant abdominal pain.  Hypertensive on presentation in the setting of acute pain.  Labs reassuring, CBC, CMP without significant derangement.  Normal lipase.  Normal troponin.  EKG without acute ischemic findings.  Ultrasound demonstrated a thickened gallbladder wall with pericholecystic fluid, but no visible gallstones or significant sludge.  Case was reviewed with Dr. Marinda with general surgery.  He did note that patient could be admitted with plans for cholecystectomy in the morning.  If patient did not wish to be admitted, alternative plan of close outpatient follow-up.  He did place admission orders initially, but when patient was updated she reports that she strongly prefers to be discharged home.  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  We reviewed her ultrasound and likelihood of recurrent symptoms.  She understands risk and benefits and continues to prefer discharge home.  Reviewed with Dr. Marinda, recommends discharge with Augmentin .  Will also DC with course of tramadol  given poor tolerance of alternative narcotics and Zofran  as needed.  Strict return precautions were provided.  Patient was discharged in stable condition.     FINAL CLINICAL IMPRESSION(S) / ED DIAGNOSES   Final diagnoses:   Thickening of wall of gallbladder with pericholecystic fluid  Right upper quadrant pain     Rx / DC Orders   ED Discharge Orders          Ordered    ondansetron  (ZOFRAN -ODT) 4 MG disintegrating tablet  Every 8 hours PRN        06/08/24 2058    traMADol  (ULTRAM ) 50 MG tablet  Every 6 hours PRN        06/08/24 2058    amoxicillin -clavulanate (AUGMENTIN ) 875-125 MG tablet  2 times daily        06/08/24 2058             Note:  This document was prepared using Dragon voice recognition software and may include unintentional dictation errors.   Levander Slate, MD 06/09/24 [phone]

## 2024-06-08 NOTE — ED Triage Notes (Signed)
 Patient to ED via POV for RUQ abd pain. Started around 4pm with nausea.

## 2024-06-08 NOTE — Discharge Instructions (Signed)
 You are seen in the emergency department today for evaluation of your abdominal pain.  I suspect that this is likely related to gallbladder disease.  As we discussed, your ultrasound did show inflammation of your gallbladder and we did did discuss admitting you to the hospital for planned surgery.  You did prefer to be discharged home.  Is very important that you follow-up closely with surgery for further evaluation.  I sent a prescription for antibiotics to your pharmacy to take for the next 7 days.  I have also sent some Zofran  that you can take as needed for nausea.  If you have severe pain, I sent a short course of tramadol .  This can make you drowsy, do not drive or operate machinery when taking this.  Return to the ER for any new or worsening symptoms.

## 2024-06-09 ENCOUNTER — Ambulatory Visit: Admit: 2024-06-09 | Admitting: General Surgery

## 2024-06-09 ENCOUNTER — Other Ambulatory Visit: Payer: Self-pay

## 2024-06-09 SURGERY — CHOLECYSTECTOMY, ROBOT-ASSISTED, LAPAROSCOPIC
Anesthesia: General

## 2024-06-21 ENCOUNTER — Ambulatory Visit: Admitting: General Surgery

## 2024-07-04 ENCOUNTER — Ambulatory Visit
Admission: EM | Admit: 2024-07-04 | Discharge: 2024-07-04 | Disposition: A | Discharge status: Home / Self Care | Attending: Emergency Medicine | Admitting: Emergency Medicine

## 2024-07-04 DIAGNOSIS — L02411 Cutaneous abscess of right axilla: Secondary | ICD-10-CM

## 2024-07-04 MED ORDER — DOXYCYCLINE HYCLATE 100 MG PO CAPS: 100.0000 mg | ORAL_CAPSULE | Freq: Two times a day (BID) | ORAL | 0 refills | Status: AC

## 2024-07-04 MED ORDER — MUPIROCIN 2 % EX OINT: 1.0000 | TOPICAL_OINTMENT | Freq: Two times a day (BID) | CUTANEOUS | 0 refills | Status: AC

## 2024-07-04 NOTE — ED Triage Notes (Addendum)
 Patient to Urgent Care with complaints of an abscess present to bilateral armpits. No drainage. Feel like the right side is worse.   Symptoms started Friday (possibly Thursday). Denies any fevers.   Taking tylenol  and ibuprofen.

## 2024-07-04 NOTE — Discharge Instructions (Addendum)
Take the doxycycline as directed.    Follow up with your primary care provider if your symptoms are not improving.    

## 2024-07-04 NOTE — ED Provider Notes (Signed)
 Cynthia Johnson    CSN: [id number] Arrival date & time: 07/04/24  0800      History   Chief Complaint Chief Complaint  Patient presents with   Abscess    HPI Cynthia Johnson is a 58 y.o. female.  Accompanied by her friend, patient presents with an abscess in her right axilla that is large and painful and red x 3 days.  It has been getting larger over the weekend.  She has been treating it with warm compresses, Tylenol , ibuprofen.  No fever or drainage.  She has several mall pustules in both axilla which she attributes to a dull razor.  She does not have any history of recurrent abscesses.  The history is provided by the patient and medical records.    Past Medical History:  Diagnosis Date   Allergy    Family history of breast cancer    updated genetic testing declined   Sinus tachycardia 2012/ 2016   Fath   Venous insufficiency 2011   SAnkar    Patient Active Problem List   Diagnosis Date Noted   Acute cholecystitis 06/08/2024   Lateral epicondylitis (tennis elbow) 08/19/2023   Grief 05/16/2018   Visit for preventive health examination 02/10/2016   Overweight (BMI 25.0-29.9) 02/04/2015   Premature menopause 02/04/2015   Insomnia 02/04/2015    Past Surgical History:  Procedure Laterality Date   FINGER SURGERY Left 11/03/1997   nerve damage to third phalange   TONSILLECTOMY Bilateral 01/13/2000    OB History     Gravida  2   Para  2   Term  2   Preterm      AB      Living  2      SAB      IAB      Ectopic      Multiple      Live Births               Home Medications    Prior to Admission medications   Medication Sig Start Date End Date Taking? Authorizing Provider  doxycycline  (VIBRAMYCIN ) 100 MG capsule Take 1 capsule (100 mg total) by mouth 2 (two) times daily for 7 days. 07/04/24 07/11/24 Yes Corlis Burnard DEL, NP  mupirocin  ointment (BACTROBAN ) 2 % Apply 1 Application topically 2 (two) times daily. 07/04/24  Yes Corlis Burnard DEL, NP   Alpha-Lipoic Acid 100 MG CAPS Take 2 capsules by mouth 2 (two) times daily.    [provider]  Calcium-Vitamin D  (CALTRATE 600 PLUS-VIT D PO) Caltrate 600 plus D    [provider]  loratadine (CLARITIN) 10 MG tablet Take 10 mg by mouth daily.    [provider]  Magnesium 250 MG TABS Take 1 tablet by mouth daily.    [provider]  meloxicam  (MOBIC ) 15 MG tablet Take 1 tablet (15 mg total) by mouth daily. 08/19/23   Tullo, Teresa L, MD  niacinamide 100 MG tablet Take 100 mg by mouth 2 (two) times daily with a meal.    [provider]  omeprazole  (PRILOSEC) 20 MG capsule Take 1 capsule (20 mg total) by mouth daily. 06/24/23   Marylynn Verneita CROME, MD  ondansetron  (ZOFRAN -ODT) 4 MG disintegrating tablet Take 1 tablet (4 mg total) by mouth every 8 (eight) hours as needed for nausea or vomiting. 06/08/24   Levander Slate, MD  phentermine  (ADIPEX-P ) 37.5 MG tablet Take 0.5 tablets (18.75 mg total) by mouth 2 (two) times daily before  meals. 08/19/23   Marylynn Verneita CROME, MD  scopolamine  (TRANSDERM-SCOP) 1 MG/3DAYS Place 1 patch (1 mg total) onto the skin every third day for 60 days 07/08/22       Family History Family History  Problem Relation Age of Onset   Thyroid  disease Mother    Cancer Mother 37       Breast BRCA Negative   Breast cancer Mother 20       BRCA neg   Hypertension Mother    Cancer Father        small cell lung CA   Heart disease Father    Hyperlipidemia Father    Cancer Brother        oral squamous cell CA (smoker)   Hypertension Brother    Cancer Maternal Grandmother 60       breast   Breast cancer Maternal Grandmother 07-29-67   Diabetes Maternal Grandmother    Lung cancer Maternal Grandfather 07/29/2047       smoker   Cancer Maternal Grandfather    Seizures Paternal Grandfather    Pemphigus vulgaris Paternal Grandfather    Cancer Maternal Aunt Jul 28, 2065       breast   Breast cancer Maternal Aunt Jul 28, 2065       deceased 11/04/17   Diabetes Maternal Aunt     Diabetes Paternal Grandmother     Social History Social History   Tobacco Use   Smoking status: Never   Smokeless tobacco: Never  Vaping Use   Vaping status: Never Used  Substance Use Topics   Alcohol use: Not Currently   Drug use: No     Allergies   Codeine and Meperidine and related   Review of Systems Review of Systems  Constitutional:  Negative for chills and fever.  Musculoskeletal:  Negative for arthralgias and joint swelling.  Skin:  Positive for color change and wound.     Physical Exam Triage Vital Signs ED Triage Vitals  Encounter Vitals Group     BP 07/04/24 0810 137/87     Girls Systolic BP Percentile --      Girls Diastolic BP Percentile --      Boys Systolic BP Percentile --      Boys Diastolic BP Percentile --      Pulse Rate 07/04/24 0810 73     Resp 07/04/24 0810 17     Temp 07/04/24 0810 98 F (36.7 C)     Temp src --      SpO2 07/04/24 0810 98 %     Weight --      Height --      Head Circumference --      Peak Flow --      Pain Score 07/04/24 0809 6     Pain Loc --      Pain Education --      Exclude from Growth Chart --    No data found.  Updated Vital Signs BP 137/87   Pulse 73   Temp 98 F (36.7 C)   Resp 17   SpO2 98%   Visual Acuity Right Eye Distance:   Left Eye Distance:   Bilateral Distance:    Right Eye Near:   Left Eye Near:    Bilateral Near:     Physical Exam Constitutional:      General: She is not in acute distress. HENT:     Mouth/Throat:     Mouth: Mucous membranes are moist.  Cardiovascular:     Rate and  Rhythm: Normal rate and regular rhythm.  Pulmonary:     Effort: Pulmonary effort is normal. No respiratory distress.  Musculoskeletal:        General: No deformity. Normal range of motion.  Skin:    General: Skin is warm and dry.     Capillary Refill: Capillary refill takes less than 2 seconds.     Findings: Erythema and lesion present.     Comments: Large tender fluctuant abscess with  localized erythema in right axilla.  Multiple small pustules and papules in both axilla.  Neurological:     General: No focal deficit present.     Mental Status: She is alert.     Sensory: No sensory deficit.     Motor: No weakness.      UC Treatments / Results  Labs (all labs ordered are listed, but only abnormal results are displayed) Labs Reviewed - No data to display  EKG   Radiology No results found.  Procedures Incision and Drainage  Date/Time: 07/04/2024 8:42 AM  Performed by: Corlis Burnard DEL, NP Authorized by: Corlis Burnard DEL, NP   Consent:    Consent obtained:  Verbal   Consent given by:  Patient   Risks discussed:  Bleeding, incomplete drainage, pain and infection Universal protocol:    Procedure explained and questions answered to patient or proxy's satisfaction: yes   Location:    Type:  Abscess   Location:  Upper extremity   Upper extremity location: right axilla. Pre-procedure details:    Skin preparation:  Povidone-iodine Anesthesia:    Anesthesia method:  Local infiltration   Local anesthetic:  Lidocaine 1% w/o epi Procedure type:    Complexity:  Simple Procedure details:    Incision types:  Single straight   Drainage:  Purulent and bloody   Drainage amount:  Copious   Wound treatment:  Wound left open   Packing materials:  None Post-procedure details:    Procedure completion:  Tolerated well, no immediate complications  (including critical care time)  Medications Ordered in UC Medications - No data to display  Initial Impression / Assessment and Plan / UC Course  I have reviewed the triage vital signs and the nursing notes.  Pertinent labs & imaging results that were available during my care of the patient were reviewed by me and considered in my medical decision making (see chart for details).    Abscess of right axilla.  I&D performed.  Wound care instructions and signs of worsening infection discussed.  Treating with doxycycline  and  mupirocin  ointment.  Instructed patient to follow-up with her PCP or a general surgeon if her symptoms are not improving.  Education provided on skin abscess.  She agrees to plan of care.  Final Clinical Impressions(s) / UC Diagnoses   Final diagnoses:  Abscess of right axilla     Discharge Instructions      Take the doxycycline  as directed.  Follow-up with your primary care provider if your symptoms are not improving.        ED Prescriptions     Medication Sig Dispense Auth. Provider   doxycycline  (VIBRAMYCIN ) 100 MG capsule Take 1 capsule (100 mg total) by mouth 2 (two) times daily for 7 days. 14 capsule Corlis Burnard H, NP   mupirocin  ointment (BACTROBAN ) 2 % Apply 1 Application topically 2 (two) times daily. 22 g Corlis Burnard DEL, NP      PDMP not reviewed this encounter.   Corlis Burnard DEL, NP 07/04/24 [phone]

## 2024-07-27 ENCOUNTER — Ambulatory Visit
Admission: EM | Admit: 2024-07-27 | Discharge: 2024-07-27 | Disposition: A | Discharge status: Home / Self Care | Attending: Emergency Medicine | Admitting: Emergency Medicine

## 2024-07-27 ENCOUNTER — Encounter: Payer: Self-pay | Admitting: Emergency Medicine

## 2024-07-27 ENCOUNTER — Other Ambulatory Visit: Payer: Self-pay

## 2024-07-27 DIAGNOSIS — L02412 Cutaneous abscess of left axilla: Secondary | ICD-10-CM | POA: Diagnosis not present

## 2024-07-27 MED ORDER — SULFAMETHOXAZOLE-TRIMETHOPRIM 800-160 MG PO TABS
1.0000 | ORAL_TABLET | Freq: Two times a day (BID) | ORAL | 0 refills | Status: AC
  Filled 2024-07-27: qty 14, 7d supply, fill #0

## 2024-07-27 NOTE — ED Provider Notes (Signed)
 Cynthia Johnson    CSN: [id number] Arrival date & time: 07/27/24  9141      History   Chief Complaint Chief Complaint  Patient presents with   Abscess    HPI Cynthia Johnson is a 58 y.o. female.  Patient presents with a tender red swollen abscess in her left axilla x 1 month.  The area has gotten larger and more painful.  No drainage or fever.  She has multiple small abscesses in both axilla.  She recently had a large abscess right axilla that required drainage and antibiotic.  Patient was seen here on 07/04/2024; diagnosed with abscess of right axilla; I&D performed; treated with doxycycline  and mupirocin  ointment.  The history is provided by the patient and medical records.    Past Medical History:  Diagnosis Date   Allergy    Family history of breast cancer    updated genetic testing declined   Sinus tachycardia 2012/ 2016   Fath   Venous insufficiency 2011   SAnkar    Patient Active Problem List   Diagnosis Date Noted   Acute cholecystitis 06/08/2024   Lateral epicondylitis (tennis elbow) 08/19/2023   Grief 05/16/2018   Visit for preventive health examination 02/10/2016   Overweight (BMI 25.0-29.9) 02/04/2015   Premature menopause 02/04/2015   Insomnia 02/04/2015    Past Surgical History:  Procedure Laterality Date   FINGER SURGERY Left 11/03/1997   nerve damage to third phalange   TONSILLECTOMY Bilateral 01/13/2000    OB History     Gravida  2   Para  2   Term  2   Preterm      AB      Living  2      SAB      IAB      Ectopic      Multiple      Live Births               Home Medications    Prior to Admission medications   Medication Sig Start Date End Date Taking? Authorizing Provider  sulfamethoxazole -trimethoprim  (BACTRIM  DS) 800-160 MG tablet Take 1 tablet by mouth 2 (two) times daily for 7 days. 07/27/24 08/03/24 Yes Corlis Burnard DEL, NP  Alpha-Lipoic Acid 100 MG CAPS Take 2 capsules by mouth 2 (two) times daily.     [provider]  Calcium-Vitamin D  (CALTRATE 600 PLUS-VIT D PO) Caltrate 600 plus D    [provider]  loratadine (CLARITIN) 10 MG tablet Take 10 mg by mouth daily.    [provider]  Magnesium 250 MG TABS Take 1 tablet by mouth daily.    [provider]  meloxicam  (MOBIC ) 15 MG tablet Take 1 tablet (15 mg total) by mouth daily. 08/19/23   Marylynn Verneita CROME, MD  mupirocin  ointment (BACTROBAN ) 2 % Apply 1 Application topically 2 (two) times daily. 07/04/24   Corlis Burnard DEL, NP  niacinamide 100 MG tablet Take 100 mg by mouth 2 (two) times daily with a meal.    [provider]  omeprazole  (PRILOSEC) 20 MG capsule Take 1 capsule (20 mg total) by mouth daily. 06/24/23   Marylynn Verneita CROME, MD  ondansetron  (ZOFRAN -ODT) 4 MG disintegrating tablet Take 1 tablet (4 mg total) by mouth every 8 (eight) hours as needed for nausea or vomiting. 06/08/24   Levander Slate, MD  phentermine  (ADIPEX-P ) 37.5 MG tablet Take 0.5 tablets (18.75 mg total) by mouth 2 (two) times daily before meals. 08/19/23  Marylynn Verneita CROME, MD  scopolamine  (TRANSDERM-SCOP) 1 MG/3DAYS Place 1 patch (1 mg total) onto the skin every third day for 60 days 07/08/22       Family History Family History  Problem Relation Age of Onset   Thyroid  disease Mother    Cancer Mother 33       Breast BRCA Negative   Breast cancer Mother 36       BRCA neg   Hypertension Mother    Cancer Father        small cell lung CA   Heart disease Father    Hyperlipidemia Father    Cancer Brother        oral squamous cell CA (smoker)   Hypertension Brother    Cancer Maternal Grandmother 60       breast   Breast cancer Maternal Grandmother 08-27-67   Diabetes Maternal Grandmother    Lung cancer Maternal Grandfather 08-27-2047       smoker   Cancer Maternal Grandfather    Seizures Paternal Grandfather    Pemphigus vulgaris Paternal Grandfather    Cancer Maternal Aunt 08-26-65       breast   Breast cancer Maternal Aunt 08/26/2065       deceased  Nov 12, 2017   Diabetes Maternal Aunt    Diabetes Paternal Grandmother     Social History Social History   Tobacco Use   Smoking status: Never   Smokeless tobacco: Never  Vaping Use   Vaping status: Never Used  Substance Use Topics   Alcohol use: Not Currently   Drug use: No     Allergies   Codeine and Meperidine and related   Review of Systems Review of Systems  Constitutional:  Negative for chills and fever.  Musculoskeletal:  Negative for arthralgias and joint swelling.  Skin:  Positive for color change and wound.  Neurological:  Negative for weakness and numbness.     Physical Exam Triage Vital Signs ED Triage Vitals [07/27/24 1007]  Encounter Vitals Group     BP 120/83     Girls Systolic BP Percentile      Girls Diastolic BP Percentile      Boys Systolic BP Percentile      Boys Diastolic BP Percentile      Pulse Rate 90     Resp 18     Temp 98 F (36.7 C)     Temp src      SpO2 97 %     Weight      Height      Head Circumference      Peak Flow      Pain Score      Pain Loc      Pain Education      Exclude from Growth Chart    No data found.  Updated Vital Signs BP 120/83   Pulse 90   Temp 98 F (36.7 C)   Resp 18   SpO2 97%   Visual Acuity Right Eye Distance:   Left Eye Distance:   Bilateral Distance:    Right Eye Near:   Left Eye Near:    Bilateral Near:     Physical Exam Constitutional:      General: She is not in acute distress. HENT:     Mouth/Throat:     Mouth: Mucous membranes are moist.  Cardiovascular:     Rate and Rhythm: Normal rate and regular rhythm.  Pulmonary:     Effort: Pulmonary effort is normal. No  respiratory distress.  Musculoskeletal:        General: No deformity. Normal range of motion.  Skin:    General: Skin is warm and dry.     Capillary Refill: Capillary refill takes less than 2 seconds.     Findings: Lesion present.     Comments: Left axilla: Quarter-size tender firm nonfluctuant area of induration;  no open wound or drainage.  Several smaller firm abscesses noted in both axilla.  The large abscess in her right axilla that was drained 3 weeks ago has healed well.  Neurological:     General: No focal deficit present.     Mental Status: She is alert.     Sensory: No sensory deficit.     Motor: No weakness.      UC Treatments / Results  Labs (all labs ordered are listed, but only abnormal results are displayed) Labs Reviewed - No data to display  EKG   Radiology No results found.  Procedures Procedures (including critical care time)  Medications Ordered in UC Medications - No data to display  Initial Impression / Assessment and Plan / UC Course  I have reviewed the triage vital signs and the nursing notes.  Pertinent labs & imaging results that were available during my care of the patient were reviewed by me and considered in my medical decision making (see chart for details).    Abscess of left axilla.  Afebrile and vital signs are stable.  No I&D indicated at this time as the abscess is firm and nonfluctuant.  Patient just completed a course of doxycycline  a few weeks ago.  Treating today with Bactrim .  Instructed her to schedule an appointment with her PCP or a dermatologist.  Education provided on skin abscess.  Patient agrees to plan of care.  Final Clinical Impressions(s) / UC Diagnoses   Final diagnoses:  Abscess of left axilla     Discharge Instructions      Take the Bactrim  as directed.  Schedule an appointment with your primary care provider or a dermatologist.     ED Prescriptions     Medication Sig Dispense Auth. Provider   sulfamethoxazole -trimethoprim  (BACTRIM  DS) 800-160 MG tablet Take 1 tablet by mouth 2 (two) times daily for 7 days. 14 tablet Corlis Burnard DEL, NP      PDMP not reviewed this encounter.   Corlis Burnard DEL, NP 07/27/24 1045

## 2024-07-27 NOTE — Discharge Instructions (Addendum)
 Take the Bactrim  as directed.  Schedule an appointment with your primary care provider or a dermatologist.

## 2024-07-27 NOTE — ED Triage Notes (Signed)
 Patient reports bilateral abscess to armpits. Patient states that she had I&D on right side on 07-04-24. Patient states she now concerned about the on one left side. Patient states no drainage. Patient taking Tylenol  and Ibuprofen for pain. Rates pain 4/10.

## 2024-07-28 ENCOUNTER — Other Ambulatory Visit: Payer: Self-pay | Admitting: Internal Medicine

## 2024-07-28 DIAGNOSIS — Z1231 Encounter for screening mammogram for malignant neoplasm of breast: Secondary | ICD-10-CM

## 2024-08-01 ENCOUNTER — Other Ambulatory Visit: Payer: Self-pay

## 2024-08-01 DIAGNOSIS — L309 Dermatitis, unspecified: Secondary | ICD-10-CM | POA: Diagnosis not present

## 2024-08-01 MED ORDER — CLINDAMYCIN PHOSPHATE 1 % EX SOLN
Freq: Every day | CUTANEOUS | 4 refills | Status: AC | PRN
  Filled 2024-08-01: qty 60, 30d supply, fill #0
  Filled 2024-08-31: qty 60, 30d supply, fill #1
  Filled 2024-11-08: qty 60, 30d supply, fill #2

## 2024-08-19 ENCOUNTER — Encounter: Payer: 59 | Admitting: Internal Medicine

## 2024-08-23 DIAGNOSIS — L309 Dermatitis, unspecified: Secondary | ICD-10-CM | POA: Diagnosis not present

## 2024-08-31 DIAGNOSIS — H524 Presbyopia: Secondary | ICD-10-CM | POA: Diagnosis not present

## 2024-09-26 ENCOUNTER — Other Ambulatory Visit: Payer: Self-pay

## 2024-09-26 ENCOUNTER — Other Ambulatory Visit (HOSPITAL_COMMUNITY)
Admission: RE | Admit: 2024-09-26 | Discharge: 2024-09-26 | Disposition: A | Source: Ambulatory Visit | Attending: Internal Medicine | Admitting: Internal Medicine

## 2024-09-26 ENCOUNTER — Encounter: Payer: Self-pay | Admitting: Internal Medicine

## 2024-09-26 ENCOUNTER — Ambulatory Visit: Admitting: Internal Medicine

## 2024-09-26 VITALS — BP 106/78 | HR 83 | Ht 66.0 in | Wt 176.8 lb

## 2024-09-26 DIAGNOSIS — E28319 Asymptomatic premature menopause: Secondary | ICD-10-CM | POA: Diagnosis not present

## 2024-09-26 DIAGNOSIS — Z8719 Personal history of other diseases of the digestive system: Secondary | ICD-10-CM

## 2024-09-26 DIAGNOSIS — Z1231 Encounter for screening mammogram for malignant neoplasm of breast: Secondary | ICD-10-CM | POA: Insufficient documentation

## 2024-09-26 DIAGNOSIS — F5102 Adjustment insomnia: Secondary | ICD-10-CM | POA: Diagnosis not present

## 2024-09-26 DIAGNOSIS — R7303 Prediabetes: Secondary | ICD-10-CM

## 2024-09-26 DIAGNOSIS — E785 Hyperlipidemia, unspecified: Secondary | ICD-10-CM | POA: Diagnosis not present

## 2024-09-26 DIAGNOSIS — E663 Overweight: Secondary | ICD-10-CM | POA: Diagnosis not present

## 2024-09-26 DIAGNOSIS — Z0000 Encounter for general adult medical examination without abnormal findings: Secondary | ICD-10-CM

## 2024-09-26 DIAGNOSIS — Z23 Encounter for immunization: Secondary | ICD-10-CM

## 2024-09-26 DIAGNOSIS — R5383 Other fatigue: Secondary | ICD-10-CM

## 2024-09-26 DIAGNOSIS — Z1211 Encounter for screening for malignant neoplasm of colon: Secondary | ICD-10-CM

## 2024-09-26 MED ORDER — ONDANSETRON 4 MG PO TBDP
4.0000 mg | ORAL_TABLET | Freq: Three times a day (TID) | ORAL | 0 refills | Status: AC | PRN
  Filled 2024-09-26: qty 20, 7d supply, fill #0

## 2024-09-26 MED ORDER — SCOPOLAMINE 1 MG/3DAYS TD PT72
MEDICATED_PATCH | TRANSDERMAL | 1 refills | Status: AC
  Filled 2024-09-26: qty 10, 30d supply, fill #0

## 2024-09-26 MED ORDER — PHENTERMINE HCL 37.5 MG PO TABS
18.7500 mg | ORAL_TABLET | Freq: Two times a day (BID) | ORAL | 2 refills | Status: AC
  Filled 2024-09-26: qty 30, 30d supply, fill #0

## 2024-09-26 MED ORDER — OMEPRAZOLE 20 MG PO CPDR
20.0000 mg | DELAYED_RELEASE_CAPSULE | Freq: Every day | ORAL | 1 refills | Status: AC
  Filled 2024-09-26: qty 90, 90d supply, fill #0

## 2024-09-26 MED ORDER — SULFAMETHOXAZOLE-TRIMETHOPRIM 800-160 MG PO TABS
1.0000 | ORAL_TABLET | Freq: Two times a day (BID) | ORAL | 2 refills | Status: AC
  Filled 2024-09-26: qty 14, 7d supply, fill #0
  Filled 2024-11-02: qty 14, 7d supply, fill #1

## 2024-09-26 NOTE — Assessment & Plan Note (Signed)
Secondary to grief.  She lost  her father in 05-02-2018 to [place],  Her mother  Died July 2020 [place] MARCH 2020 . She is currently sleeping without use of sedatives.

## 2024-09-26 NOTE — Assessment & Plan Note (Signed)
 Diagnosed August 6 after presenting to ER with RUQ pain and nausea.  CBC and CMET were normal.  She deferred surgery

## 2024-09-26 NOTE — Progress Notes (Addendum)
 Patient ID: Cynthia Johnson, female    DOB: July 15, 1966  Age: 58 y.o. MRN: [id number]  The patient is here for annual preventive examination and management of other chronic and acute problems.   The risk factors are reflected in the social history.   The roster of all physicians providing medical care to patient - is listed in the Snapshot section of the chart.   Activities of daily living:  The patient is 100% independent in all ADLs: dressing, toileting, feeding as well as independent mobility   Home safety : The patient has smoke detectors in the home. They wear seatbelts.  There are no unsecured firearms at home. There is no violence in the home.    There is no risks for hepatitis, STDs or HIV. There is no   history of blood transfusion. They have no travel history to infectious disease endemic areas of the world.   The patient has seen their dentist in the last six month. They have seen their eye doctor in the last year. The patinet  denies slight hearing difficulty with regard to whispered voices and some television programs.  They have deferred audiologic testing in the last year.  They do not  have excessive sun exposure. Discussed the need for sun protection: hats, long sleeves and use of sunscreen if there is significant sun exposure.    Diet: the importance of a healthy diet is discussed. They do have a healthy diet.   The benefits of regular aerobic exercise were discussed. The patient  walks 3 days per week for 20  minutes.    Depression screen: there are no signs or vegative symptoms of depression- irritability, change in appetite, anhedonia, sadness/tearfullness.   The following portions of the patient's history were reviewed and updated as appropriate: allergies, current medications, past family history, past medical history,  past surgical history, past social history  and problem list.   Visual acuity was not assessed per patient preference since the patient has regular follow  up with an  ophthalmologist. Hearing and body mass index were assessed and reviewed.    During the course of the visit the patient was educated and counseled about appropriate screening and preventive services including : fall prevention , diabetes screening, nutrition counseling, colorectal cancer screening, and recommended immunizations.    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  .  Referred to dermatology to rule out HS . Given clindamycin  solution to use pre shaving,  and boric acid  for prn use.  Used hibiclens during flares   3) on  August  6 had an episode of RUQ pain acc'd by nausea. Diagnosed by ER with acalculous cholecystitis. Thickened GB wall .  No stones,  no CBD dilation,  CBC and CMET normal.  Deferred surgery.  Had eaten chick Fil A   Review of Symptoms  Patient denies headache, fevers, malaise, unintentional weight loss, skin rash, eye pain, sinus congestion and sinus pain, sore throat, dysphagia,  hemoptysis , cough, dyspnea, wheezing, chest pain, palpitations, orthopnea, edema, abdominal pain, nausea, melena, diarrhea, constipation, flank pain, dysuria, hematuria, urinary  Frequency, nocturia, numbness, tingling, seizures,  Focal weakness, Loss of consciousness,  Tremor, insomnia, depression, anxiety, and suicidal ideation.    Physical  Exam:  BP 106/78   Pulse 83   Ht 5' 6 (1.676 m)   Wt 176 lb 12.8 oz (80.2 kg)   SpO2 99%   BMI 28.54 kg/m    Physical Exam Vitals reviewed.  Constitutional:      General: She is not in acute distress.    Appearance: Normal appearance. She is well-developed and normal weight. She is not ill-appearing, toxic-appearing or diaphoretic.  HENT:     Head: Normocephalic.     Right Ear: Tympanic  membrane, ear canal and external ear normal. There is no impacted cerumen.     Left Ear: Tympanic membrane, ear canal and external ear normal. There is no impacted cerumen.     Nose: Nose normal.     Mouth/Throat:     Mouth: Mucous membranes are moist.     Pharynx: Oropharynx is clear.  Eyes:     General: No scleral icterus.       Right eye: No discharge.        Left eye: No discharge.     Conjunctiva/sclera: Conjunctivae normal.     Pupils: Pupils are equal, round, and reactive to light.  Neck:     Thyroid : No thyromegaly.     Vascular: No carotid bruit or JVD.  Cardiovascular:     Rate and Rhythm: Normal rate and regular rhythm.     Heart sounds: Normal heart sounds.  Pulmonary:     Effort: Pulmonary effort is normal. No respiratory distress.     Breath sounds: Normal breath sounds.  Chest:  Breasts:    Breasts are symmetrical.     Right: Normal. No swelling, inverted nipple, mass, nipple discharge, skin change or tenderness.     Left: Normal. No swelling, inverted nipple, mass, nipple discharge, skin change or tenderness.  Abdominal:     General: Bowel sounds are normal.     Palpations: Abdomen is soft. There is no mass.     Tenderness: There is no abdominal tenderness. There is no guarding or rebound.     Hernia: There is no hernia in the left inguinal area or right inguinal area.  Genitourinary:    Exam position: Lithotomy position.     Pubic Area: No rash or pubic lice.      Labia:        Right: No rash, tenderness, lesion or injury.        Left: No rash, tenderness, lesion or injury.      Vagina: Normal.     Cervix: Normal.     Uterus: Normal.      Adnexa: Right adnexa normal and left adnexa normal.  Musculoskeletal:        General: Normal range of motion.     Cervical back: Normal range of motion and neck supple.  Lymphadenopathy:     Cervical: No cervical adenopathy.     Upper Body:     Right upper body: No supraclavicular, axillary or pectoral adenopathy.      Left upper body: No supraclavicular, axillary or pectoral adenopathy.     Lower Body: No right inguinal adenopathy. No left inguinal adenopathy.  Skin:    General: Skin is warm and dry.  Neurological:     General: No focal deficit present.     Mental Status: She is alert and oriented to person, place, and time. Mental status is at baseline.  Psychiatric:        Mood and Affect: Mood normal.  Behavior: Behavior normal.        Thought Content: Thought content normal.        Judgment: Judgment normal.     Assessment and Plan: Visit for preventive health examination Assessment & Plan: age appropriate education and counseling updated, referrals for preventative services and immunizations addressed, dietary and smoking counseling addressed, most recent labs reviewed.  I have personally reviewed and have noted:   1) the patient's medical and social history 2) The pt's use of alcohol, tobacco, and illicit drugs 3) The patient's current medications and supplements 4) Functional ability including ADL's, fall risk, home safety risk, hearing and visual impairment 5) Diet and physical activities 6) Evidence for depression or mood disorder 7) The patient's height, weight, and BMI have been recorded in the chart.     I have made referrals, and provided counseling and education based on review of the above    Colon cancer screening -     Cologuard  Encounter for screening mammogram for malignant neoplasm of breast -     Cytology - PAP  Encounter for preventative adult health care examination  Fatigue, unspecified type -     CBC with Differential/Platelet -     TSH  Prediabetes -     Comprehensive metabolic panel with GFR -     Hemoglobin A1c  Hyperlipidemia, unspecified hyperlipidemia type -     Lipid panel -     LDL cholesterol, direct  Overweight (BMI 25.0-29.9) Assessment & Plan:  historical use of phentermine   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  CBC and CMET were normal.  She deferred surgery   Adjustment insomnia Assessment & Plan: Secondary to grief.  She lost  her father in 06/24/2019to [place],  Her mother  Died 07-24-20OF METASTATIC MELANOMA DIAGNOSED IN MARCH 2020 . She is currently sleeping without use of sedatives.   Need for pneumococcal 20-valent conjugate vaccination -     Pneumococcal conjugate vaccine 20-valent  Hyperlipidemia LDL goal <160 Assessment & Plan: 10 yr risk of CAD using the AHA risk calculator is <2%.  Patient has no incidental evidence of atherosclerosis .  No treatment advised at this time   Lab Results  Component Value Date   CHOL 187 09/26/2024   HDL 54.20 09/26/2024   LDLCALC 111 (H) 09/26/2024   LDLDIRECT 122.0 09/26/2024   TRIG 107.0 09/26/2024   CHOLHDL 3 09/26/2024      Premature menopause Assessment & Plan: Since 2007,  With history of use of OCPS for birth control (vs HRT)  per Gyn until  Age 33 . PAP smear today noted atrophy and inflammation    Other orders -     Omeprazole ; Take 1 capsule (20 mg total) by mouth daily.  Dispense: 90 capsule; Refill: 1 -     Ondansetron ; Take 1 tablet (4 mg total) by mouth every 8 (eight) hours as needed for nausea or vomiting.  Dispense: 20 tablet; Refill: 0 -     Scopolamine ; Place 1 patch (1 mg total) onto the skin every third day for 60 days  Dispense: 10 patch; Refill: 1 -     Sulfamethoxazole -Trimethoprim ; Take 1 tablet by mouth 2 (two) times daily. as needed for  folliculitis  Dispense: 14 tablet; Refill: 2 -  Phentermine  HCl; Take 0.5 tablets (18.75 mg total) by mouth 2 (two) times daily before meals.  Dispense: 30 tablet; Refill: 2    No follow-ups on file.  Cynthia LITTIE Kettering, MD

## 2024-09-26 NOTE — Patient Instructions (Signed)
 Use the septra  before your mammogram (one week prior) and at the start of any signs of recurrent folliculitis.    Use the hibiclens once a week as maintenance.  You can use it more frequently if you apply only to axillary areas    I will initiate the order for your colon cancer screening  Test, the one called  Cologuard.

## 2024-09-26 NOTE — Assessment & Plan Note (Signed)
 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

## 2024-09-26 NOTE — Assessment & Plan Note (Signed)

## 2024-09-27 LAB — COMPREHENSIVE METABOLIC PANEL WITH GFR
ALT: 15 U/L (ref 0–35)
AST: 17 U/L (ref 0–37)
Albumin: 5.1 g/dL (ref 3.5–5.2)
Alkaline Phosphatase: 92 U/L (ref 39–117)
BUN: 19 mg/dL (ref 6–23)
CO2: 28 meq/L (ref 19–32)
Calcium: 10.3 mg/dL (ref 8.4–10.5)
Chloride: 103 meq/L (ref 96–112)
Creatinine, Ser: 0.9 mg/dL (ref 0.40–1.20)
GFR: 71.45 mL/min (ref >60.00)
Glucose, Bld: 95 mg/dL (ref 70–99)
Potassium: 4.6 meq/L (ref 3.5–5.1)
Sodium: 140 meq/L (ref 135–145)
Total Bilirubin: 0.5 mg/dL (ref 0.2–1.2)
Total Protein: 7.5 g/dL (ref 6.0–8.3)

## 2024-09-27 LAB — CBC WITH DIFFERENTIAL/PLATELET
Basophils Absolute: 0 K/uL (ref 0.0–0.1)
Basophils Relative: 0.6 % (ref 0.0–3.0)
Eosinophils Absolute: 0 K/uL (ref 0.0–0.7)
Eosinophils Relative: 0.8 % (ref 0.0–5.0)
HCT: 39.7 % (ref 36.0–46.0)
Hemoglobin: 13.3 g/dL (ref 12.0–15.0)
Lymphocytes Relative: 21.1 % (ref 12.0–46.0)
Lymphs Abs: 1.1 K/uL (ref 0.7–4.0)
MCHC: 33.6 g/dL (ref 30.0–36.0)
MCV: 87.2 fl (ref 78.0–100.0)
Monocytes Absolute: 0.4 K/uL (ref 0.1–1.0)
Monocytes Relative: 8.1 % (ref 3.0–12.0)
Neutro Abs: 3.5 K/uL (ref 1.4–7.7)
Neutrophils Relative %: 69.4 % (ref 43.0–77.0)
Platelets: 264 K/uL (ref 150.0–400.0)
RBC: 4.55 Mil/uL (ref 3.87–5.11)
RDW: 13.5 % (ref 11.5–15.5)
WBC: 5 K/uL (ref 4.0–10.5)

## 2024-09-27 LAB — LIPID PANEL
Cholesterol: 187 mg/dL (ref 0–200)
HDL: 54.2 mg/dL (ref >39.00)
LDL Cholesterol: 111 mg/dL — ABNORMAL HIGH (ref 0–99)
NonHDL: 132.62
Total CHOL/HDL Ratio: 3
Triglycerides: 107 mg/dL (ref 0.0–149.0)
VLDL: 21.4 mg/dL (ref 0.0–40.0)

## 2024-09-27 LAB — TSH: TSH: 2.76 u[IU]/mL (ref 0.35–5.50)

## 2024-09-27 LAB — LDL CHOLESTEROL, DIRECT: Direct LDL: 122 mg/dL

## 2024-09-27 LAB — HEMOGLOBIN A1C: Hgb A1c MFr Bld: 5.4 % (ref 4.6–6.5)

## 2024-09-28 LAB — CYTOLOGY - PAP: Diagnosis: NEGATIVE

## 2024-09-30 ENCOUNTER — Ambulatory Visit: Payer: Self-pay | Admitting: Internal Medicine

## 2024-09-30 ENCOUNTER — Encounter: Payer: Self-pay | Admitting: Internal Medicine

## 2024-09-30 DIAGNOSIS — E785 Hyperlipidemia, unspecified: Secondary | ICD-10-CM | POA: Insufficient documentation

## 2024-09-30 NOTE — Assessment & Plan Note (Signed)
 Since 2007,  With history of use of OCPS for birth control (vs HRT)  per Gyn until  Age 58 . PAP smear today noted atrophy and inflammation

## 2024-09-30 NOTE — Assessment & Plan Note (Signed)
 10 yr risk of CAD using the AHA risk calculator is <2%.  Patient has no incidental evidence of atherosclerosis .  No treatment advised at this time   Lab Results  Component Value Date   CHOL 187 09/26/2024   HDL 54.20 09/26/2024   LDLCALC 111 (H) 09/26/2024   LDLDIRECT 122.0 09/26/2024   TRIG 107.0 09/26/2024   CHOLHDL 3 09/26/2024

## 2024-10-05 DIAGNOSIS — Z1211 Encounter for screening for malignant neoplasm of colon: Secondary | ICD-10-CM | POA: Diagnosis not present

## 2024-10-10 ENCOUNTER — Inpatient Hospital Stay: Admission: RE | Admit: 2024-10-10 | Discharge: 2024-10-10 | Attending: Internal Medicine | Admitting: Internal Medicine

## 2024-10-10 DIAGNOSIS — Z1231 Encounter for screening mammogram for malignant neoplasm of breast: Secondary | ICD-10-CM | POA: Diagnosis not present

## 2024-10-10 LAB — COLOGUARD: COLOGUARD: NEGATIVE

## 2024-11-02 ENCOUNTER — Encounter: Payer: Self-pay | Admitting: Internal Medicine

## 2025-09-27 ENCOUNTER — Encounter: Admitting: Internal Medicine
